# Patient Record
Sex: Female | Born: 1990 | Race: Black or African American | Hispanic: No | Marital: Single | Smoking: Former smoker
Health system: Southern US, Community
[De-identification: ages and names within clinical notes are randomized; demographics above are authoritative.]

## PROBLEM LIST (undated history)

## (undated) DIAGNOSIS — N289 Disorder of kidney and ureter, unspecified: Secondary | ICD-10-CM

## (undated) DIAGNOSIS — J45909 Unspecified asthma, uncomplicated: Secondary | ICD-10-CM

## (undated) DIAGNOSIS — G43909 Migraine, unspecified, not intractable, without status migrainosus: Secondary | ICD-10-CM

---

## 2017-10-15 ENCOUNTER — Emergency Department (HOSPITAL_COMMUNITY)
Admission: EM | Admit: 2017-10-15 | Discharge: 2017-10-15 | Disposition: A | Discharge status: Home / Self Care | Payer: Self-pay | Attending: Emergency Medicine | Admitting: Emergency Medicine

## 2017-10-15 ENCOUNTER — Other Ambulatory Visit: Payer: Self-pay

## 2017-10-15 ENCOUNTER — Encounter (HOSPITAL_COMMUNITY): Payer: Self-pay

## 2017-10-15 ENCOUNTER — Emergency Department (HOSPITAL_COMMUNITY): Payer: Self-pay

## 2017-10-15 DIAGNOSIS — F17210 Nicotine dependence, cigarettes, uncomplicated: Secondary | ICD-10-CM | POA: Insufficient documentation

## 2017-10-15 DIAGNOSIS — G8929 Other chronic pain: Secondary | ICD-10-CM | POA: Insufficient documentation

## 2017-10-15 DIAGNOSIS — M25511 Pain in right shoulder: Secondary | ICD-10-CM | POA: Insufficient documentation

## 2017-10-15 DIAGNOSIS — R519 Headache, unspecified: Secondary | ICD-10-CM

## 2017-10-15 DIAGNOSIS — M545 Low back pain: Secondary | ICD-10-CM | POA: Insufficient documentation

## 2017-10-15 DIAGNOSIS — J45909 Unspecified asthma, uncomplicated: Secondary | ICD-10-CM | POA: Insufficient documentation

## 2017-10-15 DIAGNOSIS — R51 Headache: Secondary | ICD-10-CM | POA: Insufficient documentation

## 2017-10-15 HISTORY — DX: Unspecified asthma, uncomplicated: J45.909

## 2017-10-15 HISTORY — DX: Migraine, unspecified, not intractable, without status migrainosus: G43.909

## 2017-10-15 LAB — CBC WITH DIFFERENTIAL/PLATELET
BASOS ABS: 0 10*3/uL (ref 0.0–0.1)
Basophils Relative: 0 %
EOS ABS: 0.1 10*3/uL (ref 0.0–0.7)
EOS PCT: 2 %
HCT: 35 % — ABNORMAL LOW (ref 36.0–46.0)
Hemoglobin: 11.9 g/dL — ABNORMAL LOW (ref 12.0–15.0)
LYMPHS PCT: 38 %
Lymphs Abs: 1.9 10*3/uL (ref 0.7–4.0)
MCH: 28.6 pg (ref 26.0–34.0)
MCHC: 34 g/dL (ref 30.0–36.0)
MCV: 84.1 fL (ref 78.0–100.0)
MONO ABS: 0.3 10*3/uL (ref 0.1–1.0)
Monocytes Relative: 6 %
Neutro Abs: 2.7 10*3/uL (ref 1.7–7.7)
Neutrophils Relative %: 54 %
PLATELETS: 219 10*3/uL (ref 150–400)
RBC: 4.16 MIL/uL (ref 3.87–5.11)
RDW: 14.3 % (ref 11.5–15.5)
WBC: 5.1 10*3/uL (ref 4.0–10.5)

## 2017-10-15 LAB — BASIC METABOLIC PANEL
Anion gap: 8 (ref 5–15)
BUN: 10 mg/dL (ref 6–20)
CALCIUM: 9.6 mg/dL (ref 8.9–10.3)
CO2: 22 mmol/L (ref 22–32)
CREATININE: 0.74 mg/dL (ref 0.44–1.00)
Chloride: 107 mmol/L (ref 98–111)
GFR calc Af Amer: >60 mL/min (ref >60)
GLUCOSE: 87 mg/dL (ref 70–99)
Potassium: 4.1 mmol/L (ref 3.5–5.1)
SODIUM: 137 mmol/L (ref 135–145)

## 2017-10-15 LAB — POC URINE PREG, ED: PREG TEST UR: NEGATIVE

## 2017-10-15 MED ORDER — DIPHENHYDRAMINE HCL 50 MG/ML IJ SOLN
25.0000 mg | Freq: Once | INTRAMUSCULAR | Status: AC
  Administered 2017-10-15: 25 mg via INTRAVENOUS
  Filled 2017-10-15: qty 1

## 2017-10-15 MED ORDER — SODIUM CHLORIDE 0.9 % IV BOLUS
1000.0000 mL | Freq: Once | INTRAVENOUS | Status: AC
  Administered 2017-10-15: 1000 mL via INTRAVENOUS

## 2017-10-15 MED ORDER — METOCLOPRAMIDE HCL 5 MG/ML IJ SOLN
10.0000 mg | Freq: Once | INTRAMUSCULAR | Status: AC
  Administered 2017-10-15: 10 mg via INTRAVENOUS
  Filled 2017-10-15: qty 2

## 2017-10-15 MED ORDER — DEXAMETHASONE SODIUM PHOSPHATE 10 MG/ML IJ SOLN
10.0000 mg | Freq: Once | INTRAMUSCULAR | Status: AC
  Administered 2017-10-15: 10 mg via INTRAVENOUS
  Filled 2017-10-15: qty 1

## 2017-10-15 MED ORDER — ACETAMINOPHEN 500 MG PO TABS
1000.0000 mg | ORAL_TABLET | Freq: Once | ORAL | Status: AC
  Administered 2017-10-15: 1000 mg via ORAL
  Filled 2017-10-15: qty 2

## 2017-10-15 MED ORDER — NAPROXEN 375 MG PO TABS: 375.0000 mg | ORAL_TABLET | Freq: Two times a day (BID) | ORAL | 0 refills | Status: DC

## 2017-10-15 NOTE — ED Triage Notes (Signed)
Patient c/o body aches and right arm pain x 2 months. Patient states she has been taking Ibuprofen with very little relief.  Patient also reports a migraine headache which she states she h as a history of x 2 days. Patient states she ha light sensitivity at night.

## 2017-10-15 NOTE — Care Management Note (Signed)
Case Management Note  CM consulted for no pcp and no ins with need to establish PCP and neuro follow up.  Discussed with [place] Scrape[place]ray, [place] cost of neuro appointment approx $250 out-of-pocket and clinic options with financial counseling and pharmacy.  Information on AVS.  Murray, PA advised she would discuss follow up with pt.  No further CM needs noted at this time.  Anvith Mauriello, Lynnae SandhoffAngela N, RN 10/15/2017, 1:29 PM

## 2017-10-15 NOTE — Discharge Instructions (Signed)
Please see the information and instructions below regarding your visit.  Your diagnoses today include:  1. Frontal headache   2. Chronic right shoulder pain   3. Chronic bilateral low back pain without sciatica    You were seen and treated in the emergency department today for headache. Fortunately, your vitals, exam, and work-up is reassuring with no apparent emergent cause for your headache at this time.  The x-rays of your shoulder and lower back show no signs of cysts or lesions that are concerning for any growths.  Tests performed today include: See side panel of your discharge paperwork for testing performed today. Vital signs are listed at the bottom of these instructions.   Medications prescribed:    Try to avoid daily or regular use of tylenol, aspirin, ibuprofen, and other overt-the-counter pain medications as this can contribute to rebound headaches.   Take any prescribed medications only as prescribed, and any over the counter medications only as directed on the packaging.  You are prescribed naproxen, a non-steroidal anti-inflammatory agent (NSAID) for pain. You may take 375mg  every 12 hours as needed for pain. If still requiring this medication around the clock for acute pain after 10 days, please see your primary healthcare provider.  Women who are pregnant, breastfeeding, or planning on becoming pregnant should not take non-steroidal anti-inflammatories such as Advil and Aleve. Tylenol is a safe over the counter pain reliever in pregnant women.  You may combine this medication with Tylenol, 650 mg every 6 hours, so you are receiving something for pain every 3 hours.  This is not a long-term medication unless under the care and direction of your primary provider. Taking this medication long-term and not under the supervision of a healthcare provider could increase the risk of stomach ulcers, kidney problems, and cardiovascular problems such as high blood pressure.   Home  care instructions:   Drink plenty of fluids at home. This will help with your headache. Be cautious with caffeine use, as this can cause your headache to rebound when the effects wear off. If you drink more than 2 cups of coffee/caffeinated tea, or caffeinated soda per day, I suggest you wean down that amount.  Please follow any educational materials contained in this packet.   Please apply Salon PAS patches to the painful areas of your shoulder and back.   Follow-up instructions: Please follow-up with your primary care provider as soon as possible for further evaluation of your symptoms if they are not completely improved.   Please follow up with neurology as soon as possible. I placed a referral to them.   Return instructions:  Please return to the Emergency Department if you experience worsening symptoms. It is VERY important that you monitor your symptoms at home. If you develop worsening headache, new fever, new neck stiffness, rash, focal weakness or numbness, or any other new or concerning symptoms, please return to the ED immediately, as these may be signs that your headache has become a potentially serious and life-threatening condition.  Please return if you have any other emergent concerns.  Additional Information:   Your vital signs today were: BP 112/78    Pulse 78    Temp 97.7 F (36.5 C) (Oral)    Resp 17    Ht 5\' 2"  (1.575 m)    Wt 47.6 kg    LMP 09/14/2017 (Approximate)    SpO2 100%    BMI 19.20 kg/m  If your blood pressure (BP) was elevated on multiple readings during  this visit above 130 for the top number or above 80 for the bottom number, please have this repeated by your primary care provider within one month. --------------  Thank you for allowing [place] to participate in your care today.

## 2017-10-15 NOTE — ED Provider Notes (Signed)
[facility]-EMERGENCY DEPT Provider Note   CSN: [id number] Arrival date & time: 10/15/17  1129     History   Chief Complaint Chief Complaint  Patient presents with  . Generalized Body Aches  . Migraine    HPI Ashley Wells is a 27 y.o. female.  HPI  Patient is a 27 year old female with a history of asthma and headaches presenting for right-sided shoulder and neck pain, low back pain, frontal headache, and abnormal sensations.  Patient reports that she has had shoulder right neck pain with intermittent numbness in her right upper arm for approximately 2 months without improvement or worsening.  Patient denies any neck or shoulder trauma.  Patient reports that her biceps area will occasionally have decreased sensation.  Patient also notes that other areas with decreased sensation include her left great toe.  Patient denies any weakness.  No fever or chills, neck stiffness, loss of bowel or bladder control.  Patient is concerned that the symptoms could be signs of MS, because she has an extensive history of MS in her family.  Patient also reporting that she has a right-sided frontal headache.  Patient reports that it is present for approximately 1 week, and is similar in quality and character to her prior headaches.  Patient does report that her headaches usually affect the occipital region.  Patient reports that months ago, she had a couple days of left blurred vision, but denies any visual changes at present.  Past Medical History:  Diagnosis Date  . Asthma   . Migraine     There are no active problems to display for this patient.   History reviewed. No pertinent surgical history.   OB History   None      Home Medications    Prior to Admission medications   Medication Sig Start Date End Date Taking? Authorizing Provider  albuterol (PROVENTIL HFA;VENTOLIN HFA) 108 (90 Base) MCG/ACT inhaler Inhale 2 puffs into the lungs every 4 (four) hours as  needed for wheezing or shortness of breath.   Yes [provider]  albuterol (PROVENTIL) (5 MG/ML) 0.5% nebulizer solution Take 5 mg by nebulization every 6 (six) hours as needed for wheezing or shortness of breath.   Yes [provider]  ibuprofen (ADVIL,MOTRIN) 200 MG tablet Take 600 mg by mouth every 6 (six) hours as needed for headache or mild pain.   Yes [provider]    Family History Family History  Problem Relation Age of Onset  . Multiple sclerosis Mother   . Asthma Mother   . Asthma Father     Social History Social History   Tobacco Use  . Smoking status: Current Every Day Smoker    Packs/day: 0.25    Types: Cigarettes  . Smokeless tobacco: Never Used  Substance Use Topics  . Alcohol use: Yes    Comment: occasionally  . Drug use: Yes    Types: Marijuana    Comment: weekends     Allergies   Shellfish allergy   Review of Systems Review of Systems  Constitutional: Negative for chills and fever.  HENT: Negative for congestion, rhinorrhea and sore throat.   Eyes: Negative for visual disturbance.  Respiratory: Negative for cough, chest tightness and shortness of breath.   Cardiovascular: Negative for chest pain, palpitations and leg swelling.  Gastrointestinal: Negative for abdominal pain, nausea and vomiting.  Genitourinary: Negative for dysuria and flank pain.  Musculoskeletal: Positive for arthralgias, back pain and myalgias. Negative for joint swelling.  Skin: Negative for rash.  Neurological: Positive for headaches. Negative for dizziness, syncope and light-headedness.     Physical Exam Updated Vital Signs BP (!) 105/54   Pulse 74   Temp 97.7 F (36.5 C) (Oral)   Resp 16   Ht 5\' 2"  (1.575 m)   Wt 47.6 kg   LMP 09/14/2017 (Approximate)   SpO2 100%   BMI 19.20 kg/m   Physical Exam  Constitutional: She is oriented to person, place, and time. She appears well-developed and well-nourished. No distress.  HENT:  Head:  Normocephalic and atraumatic.  Mouth/Throat: Oropharynx is clear and moist.  Eyes: Pupils are equal, round, and reactive to light. Conjunctivae and EOM are normal.  Neck: Normal range of motion. Neck supple.  Cardiovascular: Normal rate, regular rhythm, S1 normal and S2 normal.  No murmur heard. Pulmonary/Chest: Effort normal and breath sounds normal. She has no wheezes. She has no rales.  Abdominal: Soft. She exhibits no distension. There is no tenderness. There is no guarding.  Musculoskeletal: Normal range of motion. She exhibits no edema or deformity.  Right shoulder with tenderness to palpation of scapula and deltoid. Normal ROM, both active and passive. Negative empty can test, Positive Neer's. No swelling, erythema or ecchymosis present. No step-off, crepitus, or deformity appreciated. 5/5 muscle strength of RUE. 2+ radial pulse, sensation intact and all compartments soft. No midline tenderness of cervical, thoracic, or lumbar spine.  There is right-sided paraspinal lumbar muscular tenderness.  Lymphadenopathy:    She has no cervical adenopathy.  Neurological: She is alert and oriented to person, place, and time.  Mental Status:  Alert, oriented, thought content appropriate, able to give a coherent history. Speech fluent without evidence of aphasia. Able to follow 2 step commands without difficulty.  Cranial Nerves:  II:  Peripheral visual fields grossly normal, pupils equal, round, reactive to light III,IV, VI: ptosis not present, extra-ocular motions intact bilaterally  V,VII: smile symmetric, facial light touch sensation equal VIII: hearing grossly normal to voice  X: uvula elevates symmetrically  XI: bilateral shoulder shrug symmetric and strong XII: midline tongue extension without fassiculations Motor:  Normal tone. 5/5 in upper and lower extremities bilaterally including strong and equal grip strength and dorsiflexion/plantar flexion Sensory: Pinprick and light touch normal in  all extremities.  Deep Tendon Reflexes: 2+ and symmetric in the biceps and patella. No clonus. Cerebellar: normal finger-to-nose with bilateral upper extremities Gait: normal gait and balance Stance:  No pronator drift and good coordination, strength, and position sense with tapping of bilateral arms (performed in sitting position). CV: distal pulses palpable throughout   Skin: Skin is warm and dry. No rash noted. No erythema.  Psychiatric: She has a normal mood and affect. Her behavior is normal. Judgment and thought content normal.  Nursing note and vitals reviewed.    ED Treatments / Results  Labs (all labs ordered are listed, but only abnormal results are displayed) Labs Reviewed  CBC WITH DIFFERENTIAL/PLATELET - Abnormal; Notable for the following components:      Result Value   Hemoglobin 11.9 (*)    HCT 35.0 (*)    All other components within normal limits  BASIC METABOLIC PANEL  POC URINE PREG, ED    EKG None  Radiology Dg Lumbar Spine Complete  Result Date: 10/15/2017 CLINICAL DATA:  Low back pain for 2 months, worsening for 2 days. EXAM: LUMBAR SPINE - COMPLETE 4+ VIEW COMPARISON:  None. 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  Vertebral bodies intact and aligned with straightened lumbar lordosis. Intervertebral disc heights preserved. No destructive bony lesions. Phleboliths project RIGHT pelvis. Sacroiliac joints are symmetric. Prevertebral and paraspinal soft tissue planes are normal. IMPRESSION: 1. No fracture deformity or malalignment. 2. Straightened lordosis. 3. Transitional anatomy. Electronically Signed   By: Awilda Metro M.D.   On: 10/15/2017 15:16   Dg Shoulder Right  Result Date: 10/15/2017 CLINICAL DATA:  RIGHT shoulder pain for 2 months, worsening for 2 days. EXAM: RIGHT SHOULDER - 2+ VIEW COMPARISON:  None. FINDINGS: The humeral head is well-formed and located. The subacromial, glenohumeral and  acromioclavicular joint spaces are intact. No destructive bony lesions. Soft tissue planes are non-suspicious. IMPRESSION: Normal. Electronically Signed   By: Awilda Metro M.D.   On: 10/15/2017 15:15    Procedures Procedures (including critical care time)  Medications Ordered in ED Medications  sodium chloride 0.9 % bolus 1,000 mL (has no administration in time range)  acetaminophen (TYLENOL) tablet 1,000 mg (has no administration in time range)  metoCLOPramide (REGLAN) injection 10 mg (has no administration in time range)  diphenhydrAMINE (BENADRYL) injection 25 mg (has no administration in time range)  dexamethasone (DECADRON) injection 10 mg (has no administration in time range)     Initial Impression / Assessment and Plan / ED Course  I have reviewed the triage vital signs and the nursing notes.  Pertinent labs & imaging results that were available during my care of the patient were reviewed by me and considered in my medical decision making (see chart for details).  Clinical Course as of Oct 15 1712  Tue Oct 15, 2017  1619 Reassessed. Headache gone and patient reports that she feels much better.   [AM]    Clinical Course User Index [AM] Elisha Ponder, PA-C   Patient without high-risk features of headache including: sudden onset/thunderclap HA, no similar headache in past, altered mental status, accompanying seizure, headache with exertion, age > 69, history of immunocompromise, fever, use of anticoagulation, family history of spontaneous SAH, concomitant drug use, toxic exposure, or history of hypercoagulable state.   Patient does have high risk family history for MS, but is having no signs or symptoms consistent with MS today.  I engaged in shared decision-making with the patient regarding MRI today versus waiting for outpatient work-up and financial counseling versus returning for any worsening symptoms.  Patient elected to wait to see if she has any returning symptoms  to be reevaluated.  I discussed this is reasonable and encouraged return for any visual disturbance, weakness or numbness in any extremity. Neurologic consult placed and CM consulted to assist with placing Wellness clinic referrals. Appreciate CM involvement in the care of this patient.   Patient shoulder examination is consistent with musculoskeletal pain, and patient has no nuchal rigidity or meningismus, or other toxic features concerning for meningitis as cause of neck and shoulder pain.  No acute neurologic symptoms to suggest cervical artery dissection.  X-rays are negative for unicameral bone cyst, bone tumor, or possible lytic lesions.  No abnormal labs.  Patient has a normal complete neurological exam, normal vital signs, normal level of consciousness, no signs of meningismus, is well-appearing/non-toxic appearing, no signs of trauma.   Imaging with CT/MRI not indicated given history and physical exam findings.   No dangerous or life-threatening conditions suspected or identified by history, physical exam, and by work-up. No indications for hospitalization identified.  Return precautions given to patient for any worsening pain, neurologic signs or symptoms, fever or  chills, neck rigidity.  Patient is in understanding and agrees with the plan of care.   Final Clinical Impressions(s) / ED Diagnoses   Final diagnoses:  Frontal headache  Chronic right shoulder pain  Chronic bilateral low back pain without sciatica    ED Discharge Orders         Ordered    naproxen (NAPROSYN) 375 MG tablet  2 times daily     10/15/17 1621    Ambulatory referral to Neurology    Comments:  An appointment is requested in approximately: 2 weeks. Patient with extensive family history of MS. A couple months ago pt had transient blurred vision in left eye, intermittent numbness in great toe, right upper extremity.   10/15/17 1624           Aviva Kluver B, PA-C 10/15/17 1721    Loren Racer,  MD 10/16/17 1328

## 2017-11-14 ENCOUNTER — Ambulatory Visit: Payer: Self-pay | Admitting: Neurology

## 2017-11-28 ENCOUNTER — Encounter: Payer: Self-pay | Admitting: Neurology

## 2017-11-28 ENCOUNTER — Telehealth: Payer: Self-pay | Admitting: *Deleted

## 2017-11-28 ENCOUNTER — Ambulatory Visit: Payer: Self-pay | Admitting: Neurology

## 2017-11-28 NOTE — Telephone Encounter (Signed)
Pt. noshowed her new patient appt. with Dr. Sater today/fim 

## 2017-12-14 ENCOUNTER — Emergency Department (HOSPITAL_COMMUNITY)
Admission: EM | Admit: 2017-12-14 | Discharge: 2017-12-14 | Disposition: A | Discharge status: Home / Self Care | Payer: Self-pay | Attending: Emergency Medicine | Admitting: Emergency Medicine

## 2017-12-14 ENCOUNTER — Emergency Department (HOSPITAL_COMMUNITY): Payer: Self-pay

## 2017-12-14 ENCOUNTER — Encounter (HOSPITAL_COMMUNITY): Payer: Self-pay

## 2017-12-14 ENCOUNTER — Other Ambulatory Visit: Payer: Self-pay

## 2017-12-14 DIAGNOSIS — M7918 Myalgia, other site: Secondary | ICD-10-CM | POA: Insufficient documentation

## 2017-12-14 DIAGNOSIS — R509 Fever, unspecified: Secondary | ICD-10-CM | POA: Insufficient documentation

## 2017-12-14 DIAGNOSIS — J189 Pneumonia, unspecified organism: Secondary | ICD-10-CM | POA: Insufficient documentation

## 2017-12-14 DIAGNOSIS — R05 Cough: Secondary | ICD-10-CM | POA: Insufficient documentation

## 2017-12-14 DIAGNOSIS — J4521 Mild intermittent asthma with (acute) exacerbation: Secondary | ICD-10-CM | POA: Insufficient documentation

## 2017-12-14 DIAGNOSIS — F1290 Cannabis use, unspecified, uncomplicated: Secondary | ICD-10-CM | POA: Insufficient documentation

## 2017-12-14 DIAGNOSIS — F17210 Nicotine dependence, cigarettes, uncomplicated: Secondary | ICD-10-CM | POA: Insufficient documentation

## 2017-12-14 LAB — I-STAT BETA HCG BLOOD, ED (MC, WL, AP ONLY)

## 2017-12-14 MED ORDER — DOXYCYCLINE HYCLATE 100 MG PO CAPS: 100.0000 mg | ORAL_CAPSULE | Freq: Two times a day (BID) | ORAL | 0 refills | Status: DC

## 2017-12-14 MED ORDER — SODIUM CHLORIDE 0.9 % IV BOLUS
500.0000 mL | Freq: Once | INTRAVENOUS | Status: AC
  Administered 2017-12-14: 500 mL via INTRAVENOUS

## 2017-12-14 MED ORDER — IPRATROPIUM-ALBUTEROL 0.5-2.5 (3) MG/3ML IN SOLN
3.0000 mL | Freq: Once | RESPIRATORY_TRACT | Status: AC
  Administered 2017-12-14: 3 mL via RESPIRATORY_TRACT
  Filled 2017-12-14: qty 3

## 2017-12-14 MED ORDER — IBUPROFEN 200 MG PO TABS
600.0000 mg | ORAL_TABLET | Freq: Once | ORAL | Status: AC
  Administered 2017-12-14: 600 mg via ORAL
  Filled 2017-12-14: qty 3

## 2017-12-14 MED ORDER — PREDNISONE 10 MG PO TABS: 40.0000 mg | ORAL_TABLET | Freq: Every day | ORAL | 0 refills | Status: AC

## 2017-12-14 NOTE — ED Provider Notes (Signed)
[facility]-EMERGENCY DEPT Provider Note   CSN: [id number] Arrival date & time: 12/14/17  1036     History   Chief Complaint Chief Complaint  Patient presents with  . Shortness of Breath    HPI Ashley Wells is a 27 y.o. female with a past medical history of asthma, who presents to ED for evaluation of influenza-like illness including generalized body aches, fever with T-max 102, decreased appetite, sore throat, cough productive with mucus for the past 2-2.5 days.  States that she was at work today when all of a sudden she became short of breath and had wheezing.  She used her inhaler with no improvement in her symptoms.  She called EMS.  She was given a breathing treatment and Solu-Medrol and wrote which she states helped her symptoms.  She reports having similar asthma exacerbations during this time of the year.  No sick contacts with similar symptoms.  Denies any chest pain, hemoptysis, vomiting, abdominal pain.  HPI  Past Medical History:  Diagnosis Date  . Asthma   . Migraine     There are no active problems to display for this patient.   History reviewed. No pertinent surgical history.   OB History   None      Home Medications    Prior to Admission medications   Medication Sig Start Date End Date Taking? Authorizing Provider  albuterol (PROVENTIL HFA;VENTOLIN HFA) 108 (90 Base) MCG/ACT inhaler Inhale 2 puffs into the lungs every 4 (four) hours as needed for wheezing or shortness of breath.   Yes [provider]  albuterol (PROVENTIL) (5 MG/ML) 0.5% nebulizer solution Take 5 mg by nebulization every 6 (six) hours as needed for wheezing or shortness of breath.   Yes [provider]  ibuprofen (ADVIL,MOTRIN) 200 MG tablet Take 600 mg by mouth every 6 (six) hours as needed for headache or mild pain.   Yes [provider]  naproxen (NAPROSYN) 375 MG tablet Take 1 tablet (375 mg total) by mouth 2 (two) times daily.  10/15/17  Yes [place] Scrape, Alyssa B, PA-C  doxycycline (VIBRAMYCIN) 100 MG capsule Take 1 capsule (100 mg total) by mouth 2 (two) times daily. 12/14/17   Dietrich Pates, PA-C    Family History Family History  Problem Relation Age of Onset  . Multiple sclerosis Mother   . Asthma Mother   . Asthma Father     Social History Social History   Tobacco Use  . Smoking status: Current Every Day Smoker    Packs/day: 0.25    Types: Cigarettes  . Smokeless tobacco: Never Used  Substance Use Topics  . Alcohol use: Yes    Comment: occasionally  . Drug use: Yes    Types: Marijuana    Comment: weekends     Allergies   Shellfish allergy   Review of Systems Review of Systems  Constitutional: Positive for appetite change, chills and fever.  HENT: Positive for congestion and sore throat. Negative for ear pain, rhinorrhea and sneezing.   Eyes: Negative for photophobia and visual disturbance.  Respiratory: Positive for cough, shortness of breath and wheezing. Negative for chest tightness.   Cardiovascular: Negative for chest pain and palpitations.  Gastrointestinal: Negative for abdominal pain, blood in stool, constipation, diarrhea, nausea and vomiting.  Genitourinary: Negative for dysuria, hematuria and urgency.  Musculoskeletal: Positive for myalgias.  Skin: Negative for rash.  Neurological: Negative for dizziness, weakness and light-headedness.     Physical Exam Updated Vital Signs BP 119/72  Pulse 77   Temp 98.5 F (36.9 C) (Oral)   Resp 20   Ht 5\' 2"  (1.575 m)   Wt 49.9 kg   LMP 12/03/2017 (Exact Date)   SpO2 100%   BMI 20.12 kg/m   Physical Exam  Constitutional: She appears well-developed and well-nourished. No distress.  Nontoxic-appearing in no acute distress.  Speaking complete sentences without difficulty.  HENT:  Head: Normocephalic and atraumatic.  Nose: Nose normal.  Eyes: Conjunctivae and EOM are normal. Left eye exhibits no discharge. No scleral icterus.  Neck:  Normal range of motion. Neck supple.  Cardiovascular: Normal rate, regular rhythm, normal heart sounds and intact distal pulses. Exam reveals no gallop and no friction rub.  No murmur heard. Pulmonary/Chest: Effort normal and breath sounds normal. No respiratory distress.  Clear breath sounds bilaterally.  Abdominal: Soft. Bowel sounds are normal. She exhibits no distension. There is no tenderness. There is no guarding.  Musculoskeletal: Normal range of motion. She exhibits no edema.  Neurological: She is alert. She exhibits normal muscle tone. Coordination normal.  Skin: Skin is warm and dry. No rash noted.  Psychiatric: She has a normal mood and affect.  Nursing note and vitals reviewed.    ED Treatments / Results  Labs (all labs ordered are listed, but only abnormal results are displayed) Labs Reviewed  I-STAT BETA HCG BLOOD, ED ([facility], [facility], [facility] ONLY)    EKG None  Radiology Dg Chest 2 View  Result Date: 12/14/2017 CLINICAL DATA:  Cough and shortness of breath. EXAM: CHEST - 2 VIEW COMPARISON:  None. FINDINGS: No pneumothorax. The heart, hila, and mediastinum are normal. The left lung is clear. Mild hazy opacity seen in the right perihilar region. No other acute abnormalities identified. IMPRESSION: Mild hazy opacity in the right perihilar region is suspicious for an infectious process given history. Atypical infections or an early pneumonia could present with this appearance. Recommend clinical correlation and follow-up to resolution. Electronically Signed   By: Gerome Sam III M.D   On: 12/14/2017 12:15    Procedures Procedures (including critical care time)  Medications Ordered in ED Medications  sodium chloride 0.9 % bolus 500 mL (500 mLs Intravenous New Bag/Given 12/14/17 1327)  ibuprofen (ADVIL,MOTRIN) tablet 600 mg (600 mg Oral Given 12/14/17 1327)  ipratropium-albuterol (DUONEB) 0.5-2.5 (3) MG/3ML nebulizer solution 3 mL (3 mLs Nebulization Given 12/14/17 1246)      Initial Impression / Assessment and Plan / ED Course  I have reviewed the triage vital signs and the nursing notes.  Pertinent labs & imaging results that were available during my care of the patient were reviewed by me and considered in my medical decision making (see chart for details).     27 year old female with a past medical history of asthma presents to ED for influenza-like illness including generalized body aches, fever with T-max 102, decreased appetite, sore throat, cough productive with mucus for the past 2-1/2 days.  She was at work earlier today when she suddenly became short of breath and had wheezing.  She called EMS and was given a DuoNeb and Solu-Medrol which she states did help her symptoms.  She reports having similar asthma exacerbation during this time of the year.  No sick contacts with similar symptoms.  Did not receive her influenza vaccine this year.  Denies any chest pain, hemoptysis.  On exam patient's breath sounds clear with no wheezing or signs of respiratory distress noted.  She is afebrile with no recent use of antipyretics.  Oxygen saturations are 100% on room air.  She is not tachypneic or tachycardic.  Chest x-ray shows possible infiltrate in the right lung.  hCG is negative.  Patient given additional DuoNeb treatment, ibuprofen and fluids with significant improvement and near resolution of her symptoms.  Patient states that she feels "much better" than these past 2 days.  Patient is out of the window for Tamiflu administration.  We will treat with antibiotics, steroids and refill for inhaler.  Suspect that her symptoms are due to asthma exacerbation in setting of possible pneumonia or influenza-like illness.  Doubt PE or other cardiac cause of symptoms.  She remains in no acute distress with stable vital signs.  Will advise her to return to ED for any severe worsening symptoms.  Portions of this note were generated with Scientist, clinical (histocompatibility and immunogenetics). Dictation errors  may occur despite best attempts at proofreading.   Final Clinical Impressions(s) / ED Diagnoses   Final diagnoses:  Mild intermittent asthma with exacerbation  Community acquired pneumonia of right lung, unspecified part of lung    ED Discharge Orders         Ordered    doxycycline (VIBRAMYCIN) 100 MG capsule  2 times daily     12/14/17 1419           Dietrich Pates, PA-C 12/14/17 1421    Doug Sou, MD 12/14/17 1625

## 2017-12-14 NOTE — ED Triage Notes (Signed)
EMS reports from home, SOB x 2 days, had cold x several days, uses nebulizer and inhaler, Hx of Asthma.   BP 118/78 HR 11 RR 28 Sp02 95 RA  10mg  Albuterol 1mg  Atrovent 125mg  Solumedraol enroute . 20ga LAC

## 2017-12-14 NOTE — Discharge Instructions (Addendum)
Take steroids beginning tomorrow. Complete the entire course of your antibiotics and steroids to prevent recurrence or worsening of your infection or asthma. Return to ED for worsening symptoms, trouble breathing or trouble swallowing, severe chest or abdominal pain, vomiting or coughing up blood.

## 2019-11-24 ENCOUNTER — Emergency Department (HOSPITAL_COMMUNITY)
Admission: EM | Admit: 2019-11-24 | Discharge: 2019-11-24 | Disposition: A | Discharge status: Home / Self Care | Payer: Self-pay | Attending: Emergency Medicine | Admitting: Emergency Medicine

## 2019-11-24 ENCOUNTER — Other Ambulatory Visit: Payer: Self-pay

## 2019-11-24 ENCOUNTER — Encounter (HOSPITAL_COMMUNITY): Payer: Self-pay | Admitting: Emergency Medicine

## 2019-11-24 DIAGNOSIS — J45909 Unspecified asthma, uncomplicated: Secondary | ICD-10-CM | POA: Insufficient documentation

## 2019-11-24 DIAGNOSIS — Z5321 Procedure and treatment not carried out due to patient leaving prior to being seen by health care provider: Secondary | ICD-10-CM | POA: Insufficient documentation

## 2019-11-24 MED ORDER — ALBUTEROL SULFATE HFA 108 (90 BASE) MCG/ACT IN AERS
2.0000 | INHALATION_SPRAY | Freq: Once | RESPIRATORY_TRACT | Status: AC
  Administered 2019-11-24: 2 via RESPIRATORY_TRACT
  Filled 2019-11-24: qty 6.7

## 2019-11-24 NOTE — ED Notes (Signed)
Unable to locate in lobby. 

## 2019-11-24 NOTE — ED Triage Notes (Signed)
Pt arrives to ED with complaints of her an asthma flare up. Pt states she woke up last night SOB and had no relief from her asthma.

## 2020-03-29 ENCOUNTER — Emergency Department (HOSPITAL_COMMUNITY): Payer: Self-pay

## 2020-03-29 ENCOUNTER — Emergency Department (HOSPITAL_COMMUNITY)
Admission: EM | Admit: 2020-03-29 | Discharge: 2020-03-29 | Disposition: A | Discharge status: Home / Self Care | Payer: Self-pay | Attending: Emergency Medicine | Admitting: Emergency Medicine

## 2020-03-29 ENCOUNTER — Encounter (HOSPITAL_COMMUNITY): Payer: Self-pay | Admitting: Emergency Medicine

## 2020-03-29 DIAGNOSIS — J45909 Unspecified asthma, uncomplicated: Secondary | ICD-10-CM | POA: Insufficient documentation

## 2020-03-29 DIAGNOSIS — N3000 Acute cystitis without hematuria: Secondary | ICD-10-CM | POA: Insufficient documentation

## 2020-03-29 DIAGNOSIS — F17210 Nicotine dependence, cigarettes, uncomplicated: Secondary | ICD-10-CM | POA: Insufficient documentation

## 2020-03-29 HISTORY — DX: Disorder of kidney and ureter, unspecified: N28.9

## 2020-03-29 LAB — URINALYSIS, ROUTINE W REFLEX MICROSCOPIC
Bacteria, UA: NONE SEEN
Bilirubin Urine: NEGATIVE
Glucose, UA: NEGATIVE mg/dL
Ketones, ur: NEGATIVE mg/dL
Nitrite: NEGATIVE
Protein, ur: NEGATIVE mg/dL
Specific Gravity, Urine: 1.002 — ABNORMAL LOW (ref 1.005–1.030)
pH: 6 (ref 5.0–8.0)

## 2020-03-29 LAB — I-STAT BETA HCG BLOOD, ED (MC, WL, AP ONLY): I-stat hCG, quantitative: <5 m[IU]/mL (ref <5)

## 2020-03-29 LAB — CBC
HCT: 32.6 % — ABNORMAL LOW (ref 36.0–46.0)
Hemoglobin: 10.6 g/dL — ABNORMAL LOW (ref 12.0–15.0)
MCH: 27.7 pg (ref 26.0–34.0)
MCHC: 32.5 g/dL (ref 30.0–36.0)
MCV: 85.1 fL (ref 80.0–100.0)
Platelets: 195 10*3/uL (ref 150–400)
RBC: 3.83 MIL/uL — ABNORMAL LOW (ref 3.87–5.11)
RDW: 14.5 % (ref 11.5–15.5)
WBC: 9.4 10*3/uL (ref 4.0–10.5)
nRBC: 0 % (ref 0.0–0.2)

## 2020-03-29 LAB — BASIC METABOLIC PANEL
Anion gap: 10 (ref 5–15)
BUN: 7 mg/dL (ref 6–20)
CO2: 25 mmol/L (ref 22–32)
Calcium: 9.7 mg/dL (ref 8.9–10.3)
Chloride: 102 mmol/L (ref 98–111)
Creatinine, Ser: 0.7 mg/dL (ref 0.44–1.00)
GFR, Estimated: >60 mL/min (ref >60)
Glucose, Bld: 101 mg/dL — ABNORMAL HIGH (ref 70–99)
Potassium: 3.1 mmol/L — ABNORMAL LOW (ref 3.5–5.1)
Sodium: 137 mmol/L (ref 135–145)

## 2020-03-29 MED ORDER — CEPHALEXIN 500 MG PO CAPS: 500.0000 mg | ORAL_CAPSULE | Freq: Two times a day (BID) | ORAL | 0 refills | Status: AC

## 2020-03-29 NOTE — ED Triage Notes (Signed)
Per EMS-coming from [facility]-complaining of right flank pain-tylenol given for fever around 1600-temp 99.4-ambulatory-no s/s's of distress-history of kidney stones

## 2020-03-29 NOTE — ED Provider Notes (Signed)
[facility]-EMERGENCY DEPT Provider Note   CSN: [id number] Arrival date & time: 03/29/20  1805     History Chief Complaint  Patient presents with  . Flank Pain    Ashley Wells is a 30 y.o. female.  The history is provided by the patient.  Flank Pain This is a new problem. The problem occurs constantly. The problem has been gradually improving. Associated symptoms include abdominal pain. Pertinent negatives include no chest pain, no headaches and no shortness of breath. Nothing aggravates the symptoms. Nothing relieves the symptoms. She has tried nothing for the symptoms.       Past Medical History:  Diagnosis Date  . Asthma   . Migraine   . Renal disorder     There are no problems to display for this patient.   History reviewed. No pertinent surgical history.   OB History   No obstetric history on file.     Family History  Problem Relation Age of Onset  . Multiple sclerosis Mother   . Asthma Mother   . Asthma Father     Social History   Tobacco Use  . Smoking status: Current Every Day Smoker    Packs/day: 0.25    Types: Cigarettes  . Smokeless tobacco: Never Used  Vaping Use  . Vaping Use: Never used  Substance Use Topics  . Alcohol use: Yes    Comment: occasionally  . Drug use: Yes    Types: Marijuana    Comment: weekends    Home Medications Prior to Admission medications   Medication Sig Start Date End Date Taking? Authorizing Provider  cephALEXin (KEFLEX) 500 MG capsule Take 1 capsule (500 mg total) by mouth 2 (two) times daily for 5 days. 03/29/20 04/03/20 Yes Leiby Pigeon, DO  albuterol (PROVENTIL HFA;VENTOLIN HFA) 108 (90 Base) MCG/ACT inhaler Inhale 2 puffs into the lungs every 4 (four) hours as needed for wheezing or shortness of breath.    [provider]  albuterol (PROVENTIL) (5 MG/ML) 0.5% nebulizer solution Take 5 mg by nebulization every 6 (six) hours as needed for wheezing or shortness of breath.     [provider]  doxycycline (VIBRAMYCIN) 100 MG capsule Take 1 capsule (100 mg total) by mouth 2 (two) times daily. 12/14/17   Khatri, Hina, PA-C  ibuprofen (ADVIL,MOTRIN) 200 MG tablet Take 600 mg by mouth every 6 (six) hours as needed for headache or mild pain.    [provider]  naproxen (NAPROSYN) 375 MG tablet Take 1 tablet (375 mg total) by mouth 2 (two) times daily. 10/15/17   Aviva Kluver B, PA-C    Allergies    Shellfish allergy  Review of Systems   Review of Systems  Constitutional: Negative for chills and fever.  HENT: Negative for ear pain and sore throat.   Eyes: Negative for pain and visual disturbance.  Respiratory: Negative for cough and shortness of breath.   Cardiovascular: Negative for chest pain and palpitations.  Gastrointestinal: Positive for abdominal pain. Negative for vomiting.  Genitourinary: Positive for dysuria and flank pain. Negative for hematuria, pelvic pain, vaginal bleeding and vaginal discharge.  Musculoskeletal: Negative for arthralgias and back pain.  Skin: Negative for color change and rash.  Neurological: Negative for seizures, syncope and headaches.  All other systems reviewed and are negative.   Physical Exam Updated Vital Signs BP 112/80 (BP Location: Right Arm)   Pulse 97   Temp 98.4 F (36.9 C) (Oral)   Resp 16   SpO2  99%   Physical Exam Vitals and nursing note reviewed.  Constitutional:      General: She is not in acute distress.    Appearance: She is well-developed and well-nourished. She is not ill-appearing.  HENT:     Head: Normocephalic and atraumatic.     Nose: Nose normal.     Mouth/Throat:     Mouth: Mucous membranes are moist.  Eyes:     Extraocular Movements: Extraocular movements intact.     Conjunctiva/sclera: Conjunctivae normal.     Pupils: Pupils are equal, round, and reactive to light.  Cardiovascular:     Rate and Rhythm: Normal rate and regular rhythm.     Pulses: Normal pulses.      Heart sounds: Normal heart sounds. No murmur heard.   Pulmonary:     Effort: Pulmonary effort is normal. No respiratory distress.     Breath sounds: Normal breath sounds.  Abdominal:     Palpations: Abdomen is soft.     Tenderness: There is no abdominal tenderness.  Musculoskeletal:        General: No edema.     Cervical back: Neck supple.  Skin:    General: Skin is warm and dry.     Capillary Refill: Capillary refill takes less than 2 seconds.  Neurological:     General: No focal deficit present.     Mental Status: She is alert.  Psychiatric:        Mood and Affect: Mood and affect normal.     ED Results / Procedures / Treatments   Labs (all labs ordered are listed, but only abnormal results are displayed) Labs Reviewed  URINALYSIS, ROUTINE W REFLEX MICROSCOPIC - Abnormal; Notable for the following components:      Result Value   Color, Urine STRAW (*)    Specific Gravity, Urine 1.002 (*)    Hgb urine dipstick MODERATE (*)    Leukocytes,Ua MODERATE (*)    All other components within normal limits  BASIC METABOLIC PANEL - Abnormal; Notable for the following components:   Potassium 3.1 (*)    Glucose, Bld 101 (*)    All other components within normal limits  CBC - Abnormal; Notable for the following components:   RBC 3.83 (*)    Hemoglobin 10.6 (*)    HCT 32.6 (*)    All other components within normal limits  URINE CULTURE  I-STAT BETA HCG BLOOD, ED ([facility], [facility], [facility] ONLY)    EKG None  Radiology CT Renal Stone Study  Result Date: 03/29/2020 CLINICAL DATA:  Right flank pain EXAM: CT ABDOMEN AND PELVIS WITHOUT CONTRAST TECHNIQUE: Multidetector CT imaging of the abdomen and pelvis was performed following the standard protocol without IV contrast. COMPARISON:  None. FINDINGS: LOWER CHEST: Normal. HEPATOBILIARY: Normal hepatic contours. No intra- or extrahepatic biliary dilatation. The gallbladder is normal. PANCREAS: Normal pancreas. No ductal dilatation or peripancreatic  fluid collection. SPLEEN: Normal. ADRENALS/URINARY TRACT: The adrenal glands are normal. No hydronephrosis, nephroureterolithiasis or solid renal mass. The urinary bladder is normal for degree of distention STOMACH/BOWEL: There is no hiatal hernia. Normal duodenal course and caliber. No small bowel dilatation or inflammation. Large amount of gas within the hepatic and splenic flexures. Normal appendix. VASCULAR/LYMPHATIC: Normal course and caliber of the major abdominal vessels. No abdominal or pelvic lymphadenopathy. REPRODUCTIVE: Normal uterus. No adnexal mass. MUSCULOSKELETAL. No bony spinal canal stenosis or focal osseous abnormality. OTHER: None. IMPRESSION: 1. No obstructive uropathy or other acute abnormality of the abdomen or pelvis. 2.  Large amount of gas within the hepatic and splenic flexures. Electronically Signed   By: Deatra Robinson M.D.   On: 03/29/2020 21:11    Procedures Procedures   Medications Ordered in ED Medications - No data to display  ED Course  I have reviewed the triage vital signs and the nursing notes.  Pertinent labs & imaging results that were available during my care of the patient were reviewed by me and considered in my medical decision making (see chart for details).    MDM Rules/Calculators/A&P                          Ashley Wells is a 30 year old female who presents to the ED with painful urination, flank pain.  Normal vitals.  No fever.  Urinalysis consistent with infection.  Not having any GU symptoms otherwise.  No vaginal discharge or bleeding.  Pregnancy test negative.  No significant anemia, electrolyte abnormality, kidney injury.  Patient does have history of kidney stones a CT scan did not show any evidence of kidney stones.  She did have some bowel gas which also could be causing some of her discomfort.  Will prescribe antibiotics and discharged from ED in good condition.  This chart was dictated using voice recognition software.  Despite best  efforts to proofread,  errors can occur which can change the documentation meaning.    Final Clinical Impression(s) / ED Diagnoses Final diagnoses:  Acute cystitis without hematuria    Rx / DC Orders ED Discharge Orders         Ordered    cephALEXin (KEFLEX) 500 MG capsule  2 times daily        03/29/20 2210           Virgina Norfolk, DO 03/29/20 2212

## 2020-03-31 LAB — URINE CULTURE: Culture: 10000 — AB

## 2020-04-01 LAB — URINE CULTURE

## 2020-04-02 ENCOUNTER — Telehealth: Payer: Self-pay | Admitting: Emergency Medicine

## 2020-04-02 NOTE — Telephone Encounter (Signed)
Post ED Visit - Positive Culture Follow-up  Culture report reviewed by antimicrobial stewardship pharmacist: [facility] []  , Pharm.D. []  Enzo Bi, Pharm.D., BCPS AQ-ID []  , Pharm.D., BCPS []  Celedonio Miyamoto, Pharm.D., BCPS []  Blanchard, Garvin Fila.D., BCPS, AAHIVP []  , Pharm.D., BCPS, AAHIVP []  Georgina Pillion, PharmD, BCPS []  , PharmD, BCPS []  [place], PharmD, BCPS []  [address], PharmD []  , PharmD, BCPS []  Estella Husk, PharmD  Pharmacy Team []  Lysle Pearl, PharmD []  , PharmD []  Phillips Climes, PharmD [x]  , Rph []  Agapito Games) , PharmD []  Verlan Friends, PharmD []  , PharmD []  Mervyn Gay, PharmD []  , PharmD []  Vinnie Level, PharmD []  [facility], PharmD []  , PharmD []  Len Childs, PharmD   Positive urine culture Treated with Cephalexin, organism sensitive to the same and no further patient follow-up is required at this time.  Tammera Engert 04/02/2020, 6:09 PM

## 2020-07-12 ENCOUNTER — Encounter (HOSPITAL_COMMUNITY): Payer: Self-pay

## 2020-07-12 ENCOUNTER — Other Ambulatory Visit: Payer: Self-pay

## 2020-07-12 ENCOUNTER — Emergency Department (HOSPITAL_COMMUNITY): Payer: Self-pay

## 2020-07-12 ENCOUNTER — Emergency Department (HOSPITAL_COMMUNITY)
Admission: EM | Admit: 2020-07-12 | Discharge: 2020-07-12 | Disposition: A | Discharge status: Home / Self Care | Payer: Self-pay | Attending: Emergency Medicine | Admitting: Emergency Medicine

## 2020-07-12 DIAGNOSIS — M791 Myalgia, unspecified site: Secondary | ICD-10-CM | POA: Insufficient documentation

## 2020-07-12 DIAGNOSIS — R112 Nausea with vomiting, unspecified: Secondary | ICD-10-CM | POA: Insufficient documentation

## 2020-07-12 DIAGNOSIS — R509 Fever, unspecified: Secondary | ICD-10-CM | POA: Insufficient documentation

## 2020-07-12 DIAGNOSIS — Z87891 Personal history of nicotine dependence: Secondary | ICD-10-CM | POA: Insufficient documentation

## 2020-07-12 DIAGNOSIS — R519 Headache, unspecified: Secondary | ICD-10-CM | POA: Insufficient documentation

## 2020-07-12 DIAGNOSIS — R0602 Shortness of breath: Secondary | ICD-10-CM | POA: Insufficient documentation

## 2020-07-12 DIAGNOSIS — Z20822 Contact with and (suspected) exposure to covid-19: Secondary | ICD-10-CM

## 2020-07-12 DIAGNOSIS — J3489 Other specified disorders of nose and nasal sinuses: Secondary | ICD-10-CM | POA: Insufficient documentation

## 2020-07-12 DIAGNOSIS — J45909 Unspecified asthma, uncomplicated: Secondary | ICD-10-CM | POA: Insufficient documentation

## 2020-07-12 DIAGNOSIS — R059 Cough, unspecified: Secondary | ICD-10-CM | POA: Insufficient documentation

## 2020-07-12 MED ORDER — ALBUTEROL SULFATE HFA 108 (90 BASE) MCG/ACT IN AERS
2.0000 | INHALATION_SPRAY | Freq: Once | RESPIRATORY_TRACT | Status: AC
  Administered 2020-07-12: 2 via RESPIRATORY_TRACT
  Filled 2020-07-12: qty 6.7

## 2020-07-12 MED ORDER — ACETAMINOPHEN 325 MG PO TABS: 650.0000 mg | ORAL_TABLET | Freq: Once | ORAL | Status: DC

## 2020-07-12 MED ORDER — AEROCHAMBER Z-STAT PLUS/MEDIUM MISC
1.0000 | Freq: Once | Status: AC
  Administered 2020-07-12: 1

## 2020-07-12 MED ORDER — ONDANSETRON 4 MG PO TBDP: 4.0000 mg | ORAL_TABLET | Freq: Three times a day (TID) | ORAL | 0 refills | Status: DC | PRN

## 2020-07-12 MED ORDER — ONDANSETRON 4 MG PO TBDP
4.0000 mg | ORAL_TABLET | Freq: Once | ORAL | Status: AC
  Administered 2020-07-12: 4 mg via ORAL
  Filled 2020-07-12: qty 1

## 2020-07-12 MED ORDER — ACETAMINOPHEN 325 MG PO TABS
650.0000 mg | ORAL_TABLET | Freq: Once | ORAL | Status: AC | PRN
  Administered 2020-07-12: 650 mg via ORAL
  Filled 2020-07-12: qty 2

## 2020-07-12 NOTE — Discharge Instructions (Addendum)
As we discussed even if your test from [facility] comes back negative I would recommend assuming you have COVID and isolating appropriately. If your symptoms worsen or you have other concerns please seek additional medical care and evaluation.  Please take Ibuprofen (Advil, motrin) and Tylenol (acetaminophen) to relieve your pain.    You may take up to 600 MG (3 pills) of normal strength ibuprofen every 8 hours as needed.   You make take tylenol, up to 1,000 mg (two extra strength pills) every 8 hours as needed.   It is safe to take ibuprofen and tylenol at the same time as they work differently.   Do not take more than 3,000 mg tylenol in a 24 hour period (not more than one dose every 8 hours.  Please check all medication labels as many medications such as pain and cold medications may contain tylenol.  Do not drink alcohol while taking these medications.  Do not take other NSAID'S while taking ibuprofen (such as aleve or naproxen).  Please take ibuprofen with food to decrease stomach upset.  Please check the CDC for latest guidelines.  You should isolate at home for five days.  After that you should wear a well fitting mask and not go around anyone who is high risk for an additional five days if you have been fever free with out fever reducing medications for 24 hours and your symptoms are improving.

## 2020-07-12 NOTE — ED Notes (Signed)
An After Visit Summary was printed and given to the patient. Discharge instructions given and no further questions at this time.  

## 2020-07-12 NOTE — ED Triage Notes (Signed)
Patient c/o fever, chills, and N/v since 0300 today.

## 2020-07-12 NOTE — ED Notes (Signed)
Pt currently denied needing anything at this time prior to exit.

## 2020-07-12 NOTE — ED Provider Notes (Signed)
Emergency Medicine Provider Triage Evaluation Note  Ashley Wells , a 30 y.o. female  was evaluated in triage.  Pt complains of approximately 15-hour history of fever, sinus pressure, cough, shortness of breath, myalgias and rhinorrhea.  1 dose of ibuprofen couple of hours ago.  Denies any vision changes.  Reports nausea and vomiting.  No sick contacts with similar symptoms.  Review of Systems  Positive: Fever, cough, shortness of breath, myalgias, rhinorrhea Negative: Neck stiffness  Physical Exam  BP 117/84 (BP Location: Left Arm)   Pulse 87   Temp (!) 100.6 F (38.1 C) (Oral)   Resp 14   Ht 5\' 2"  (1.575 m)   Wt 45.4 kg   LMP 07/12/2020   SpO2 100%   BMI 18.29 kg/m  Gen:   Awake, no distress   Resp:  Normal effort  MSK:   Moves extremities without difficulty   Medical Decision Making  Medically screening exam initiated at 7:14 PM.  Appropriate orders placed.  07/14/2020 Ashley Wells was informed that the remainder of the evaluation will be completed by another provider, this initial triage assessment does not replace that evaluation, and the importance of remaining in the ED until their evaluation is complete.  Will obtain chest x-ray and may need COVID testing.  Tylenol antiemetic given   Ashley [place], Dietrich Pates 07/12/20 1918    07/14/20, MD 07/15/20 [phone]

## 2020-07-12 NOTE — ED Provider Notes (Signed)
[facility]-EMERGENCY DEPT Provider Note   CSN: [id number] Arrival date & time: 07/12/20  1843     History Chief Complaint  Patient presents with  . Fever  . Nausea  . Emesis    Ashley Wells is a 30 y.o. female with a past medical history of asthma, not on any daily medications who presents today for evaluation of fever, sinus pressure, cough, shortness of breath, myalgias, rhinorrhea, headache, nausea, vomiting and generally feeling unwell.  This woke her up at 3 AM this morning.  She was feeling well when she went to bed last night.  She did have 2 COVID vaccines however her last 1 was over a year ago and she did not get boosted.  She denies any known COVID contacts. She was given Tylenol and Zofran in triage.  She states that she had ibuprofen a few hours ago.  She denies any focal pain or abdominal pain.  She does have a headache however reports that she has not struck her head, does not have any weakness, and states that her head does not hurt more than the rest of her body. She denies specific chest pain or leg swelling.  She had a COVID test obtained earlier today, does not yet have results.  HPI     Past Medical History:  Diagnosis Date  . Asthma   . Migraine   . Renal disorder     There are no problems to display for this patient.   History reviewed. No pertinent surgical history.   OB History   No obstetric history on file.     Family History  Problem Relation Age of Onset  . Multiple sclerosis Mother   . Asthma Mother   . Asthma Father     Social History   Tobacco Use  . Smoking status: Former Smoker    Packs/day: 0.25    Types: Cigarettes  . Smokeless tobacco: Never Used  Vaping Use  . Vaping Use: Never used  Substance Use Topics  . Alcohol use: Yes    Comment: occasionally  . Drug use: Not Currently    Types: Marijuana    Comment: weekends    Home Medications Prior to Admission medications   Medication Sig Start  Date End Date Taking? Authorizing Provider  ondansetron (ZOFRAN ODT) 4 MG disintegrating tablet Take 1 tablet (4 mg total) by mouth every 8 (eight) hours as needed for nausea or vomiting. 07/12/20  Yes Cristina Gong, PA-C  albuterol (PROVENTIL HFA;VENTOLIN HFA) 108 (90 Base) MCG/ACT inhaler Inhale 2 puffs into the lungs every 4 (four) hours as needed for wheezing or shortness of breath.    [provider]  albuterol (PROVENTIL) (5 MG/ML) 0.5% nebulizer solution Take 5 mg by nebulization every 6 (six) hours as needed for wheezing or shortness of breath.    [provider]  doxycycline (VIBRAMYCIN) 100 MG capsule Take 1 capsule (100 mg total) by mouth 2 (two) times daily. 12/14/17   Khatri, Hina, PA-C  ibuprofen (ADVIL,MOTRIN) 200 MG tablet Take 600 mg by mouth every 6 (six) hours as needed for headache or mild pain.    [provider]  naproxen (NAPROSYN) 375 MG tablet Take 1 tablet (375 mg total) by mouth 2 (two) times daily. 10/15/17   Aviva Kluver B, PA-C    Allergies    Shellfish allergy  Review of Systems   Review of Systems  Constitutional: Positive for chills, fatigue and fever. Negative for diaphoresis.  HENT:  Positive for congestion, rhinorrhea and sore throat. Negative for dental problem, ear discharge, trouble swallowing and voice change.   Eyes: Negative for visual disturbance.  Respiratory: Positive for cough and shortness of breath.   Cardiovascular: Negative for chest pain, palpitations and leg swelling.  Gastrointestinal: Positive for nausea and vomiting. Negative for abdominal pain.  Genitourinary: Negative for dysuria.  Musculoskeletal: Positive for arthralgias and myalgias. Negative for neck pain and neck stiffness.  Skin: Negative for color change and rash.  Allergic/Immunologic: Negative for immunocompromised state.  Neurological: Positive for headaches. Negative for speech difficulty, weakness, light-headedness and numbness.  All other  systems reviewed and are negative.   Physical Exam Updated Vital Signs BP 121/68   Pulse 80   Temp 99.8 F (37.7 C) (Oral)   Resp 18   Ht 5\' 2"  (1.575 m)   Wt 45.4 kg   LMP 07/12/2020   SpO2 99%   BMI 18.29 kg/m   Physical Exam Vitals and nursing note reviewed.  Constitutional:      General: She is not in acute distress.    Appearance: She is not ill-appearing.  HENT:     Head: Atraumatic.     Mouth/Throat:     Mouth: Mucous membranes are moist.     Pharynx: Oropharynx is clear. No oropharyngeal exudate or posterior oropharyngeal erythema.  Eyes:     Conjunctiva/sclera: Conjunctivae normal.  Cardiovascular:     Rate and Rhythm: Normal rate.     Pulses: Normal pulses.     Heart sounds: Normal heart sounds.  Pulmonary:     Effort: Pulmonary effort is normal. No respiratory distress.     Breath sounds: Normal breath sounds. No stridor. No wheezing or rhonchi.  Abdominal:     General: There is no distension.     Tenderness: There is no abdominal tenderness. There is no guarding.  Musculoskeletal:     Cervical back: Normal range of motion and neck supple.     Comments: No obvious acute injury  Skin:    General: Skin is warm.  Neurological:     Mental Status: She is alert.     Comments: Awake and alert, answers all questions appropriately.  Speech is not slurred.  Coordination is grossly intact, able to ambulate at bedside for ambulatory saturations without ataxia, obvious weakness or difficulty.  Psychiatric:        Mood and Affect: Mood normal.        Behavior: Behavior normal.     ED Results / Procedures / Treatments   Labs (all labs ordered are listed, but only abnormal results are displayed) Labs Reviewed - No data to display  EKG None  Radiology DG Chest 2 View  Result Date: 07/12/2020 CLINICAL DATA:  Dyspnea, fever, chills EXAM: CHEST - 2 VIEW COMPARISON:  11/11/2019 FINDINGS: The heart size and mediastinal contours are within normal limits. Both lungs  are clear. The visualized skeletal structures are unremarkable. IMPRESSION: No active cardiopulmonary disease. Electronically Signed   By: 11/13/2019 MD   On: 07/12/2020 19:42    Procedures Procedures   Medications Ordered in ED Medications  acetaminophen (TYLENOL) tablet 650 mg (650 mg Oral Given 07/12/20 1912)  ondansetron (ZOFRAN-ODT) disintegrating tablet 4 mg (4 mg Oral Given 07/12/20 2000)  albuterol (VENTOLIN HFA) 108 (90 Base) MCG/ACT inhaler 2 puff (2 puffs Inhalation Given 07/12/20 2138)  aerochamber Z-Stat Plus/medium 1 each (1 each Other Given 07/12/20 2138)    ED Course  I have reviewed the triage vital  signs and the nursing notes.  Pertinent labs & imaging results that were available during my care of the patient were reviewed by me and considered in my medical decision making (see chart for details).    MDM Rules/Calculators/A&P                         Autry Droege Hochstein was evaluated in Emergency Department on 07/13/2020 for the symptoms described in the history of present illness. She was evaluated in the context of the global COVID-19 pandemic, which necessitated consideration that the patient might be at risk for infection with the SARS-CoV-2 virus that causes COVID-19. Institutional protocols and algorithms that pertain to the evaluation of patients at risk for COVID-19 are in a state of rapid change based on information released by regulatory bodies including the CDC and federal and state organizations. These policies and algorithms were followed during the patient's care in the ED.  Patient is a 30 year old woman who presents today for evaluation of sudden onset of multiple symptoms including fevers, chills, nausea/vomiting, myalgias, arthralgias, cough, and headache that started this morning.  She has had 2 COVID vaccines however is not listed. Here she is able to ambulate with her oxygen remaining 100% on room air.  She was febrile on arrival, was treated with Tylenol  and given Zofran, after this she stated that she felt better.  She notes that when she coughs she feels wheezy, she does not have wheezes auscultated at rest.  Albuterol as ordered.  She has a outpatient COVID test pending.  I advised her that even if this returns negative she needs to quarantine appropriately assuming she may have COVID.  X-ray without active abnormalities. Given that she does have shortness of breath EKG is ordered showing no acute abnormalities. .    At this time she does not have a positive covid test and therefore does not meet criteria for MAB.   Return precautions were discussed with patient who states their understanding.  At the time of discharge patient denied any unaddressed complaints or concerns.  Patient is agreeable for discharge home.  Note: Portions of this report may have been transcribed using voice recognition software. Every effort was made to ensure accuracy; however, inadvertent computerized transcription errors may be present   Final Clinical Impression(s) / ED Diagnoses Final diagnoses:  Suspected COVID-19 virus infection    Rx / DC Orders ED Discharge Orders         Ordered    ondansetron (ZOFRAN ODT) 4 MG disintegrating tablet  Every 8 hours PRN        07/12/20 2132           Cristina Gong, PA-C 07/13/20 0030    Bethann Berkshire, MD 07/13/20 1500

## 2020-08-11 ENCOUNTER — Encounter (HOSPITAL_COMMUNITY): Payer: Self-pay | Admitting: Emergency Medicine

## 2020-08-11 ENCOUNTER — Emergency Department (HOSPITAL_COMMUNITY)
Admission: EM | Admit: 2020-08-11 | Discharge: 2020-08-11 | Disposition: A | Discharge status: Home / Self Care | Payer: Self-pay | Attending: Emergency Medicine | Admitting: Emergency Medicine

## 2020-08-11 ENCOUNTER — Other Ambulatory Visit: Payer: Self-pay

## 2020-08-11 DIAGNOSIS — R300 Dysuria: Secondary | ICD-10-CM | POA: Insufficient documentation

## 2020-08-11 DIAGNOSIS — R109 Unspecified abdominal pain: Secondary | ICD-10-CM | POA: Insufficient documentation

## 2020-08-11 DIAGNOSIS — Z87891 Personal history of nicotine dependence: Secondary | ICD-10-CM | POA: Insufficient documentation

## 2020-08-11 DIAGNOSIS — R062 Wheezing: Secondary | ICD-10-CM | POA: Insufficient documentation

## 2020-08-11 DIAGNOSIS — R110 Nausea: Secondary | ICD-10-CM | POA: Insufficient documentation

## 2020-08-11 LAB — CBC WITH DIFFERENTIAL/PLATELET
Abs Immature Granulocytes: 0.01 10*3/uL (ref 0.00–0.07)
Basophils Absolute: 0.1 10*3/uL (ref 0.0–0.1)
Basophils Relative: 1 %
Eosinophils Absolute: 0.4 10*3/uL (ref 0.0–0.5)
Eosinophils Relative: 6 %
HCT: 33.2 % — ABNORMAL LOW (ref 36.0–46.0)
Hemoglobin: 10.8 g/dL — ABNORMAL LOW (ref 12.0–15.0)
Immature Granulocytes: 0 %
Lymphocytes Relative: 33 %
Lymphs Abs: 2.6 10*3/uL (ref 0.7–4.0)
MCH: 28.4 pg (ref 26.0–34.0)
MCHC: 32.5 g/dL (ref 30.0–36.0)
MCV: 87.4 fL (ref 80.0–100.0)
Monocytes Absolute: 0.5 10*3/uL (ref 0.1–1.0)
Monocytes Relative: 6 %
Neutro Abs: 4.2 10*3/uL (ref 1.7–7.7)
Neutrophils Relative %: 54 %
Platelets: 183 10*3/uL (ref 150–400)
RBC: 3.8 MIL/uL — ABNORMAL LOW (ref 3.87–5.11)
RDW: 14.1 % (ref 11.5–15.5)
WBC: 7.8 10*3/uL (ref 4.0–10.5)
nRBC: 0 % (ref 0.0–0.2)

## 2020-08-11 LAB — I-STAT BETA HCG BLOOD, ED (MC, WL, AP ONLY): I-stat hCG, quantitative: <5 m[IU]/mL (ref <5)

## 2020-08-11 LAB — COMPREHENSIVE METABOLIC PANEL
ALT: 16 U/L (ref 0–44)
AST: 17 U/L (ref 15–41)
Albumin: 4.1 g/dL (ref 3.5–5.0)
Alkaline Phosphatase: 65 U/L (ref 38–126)
Anion gap: 8 (ref 5–15)
BUN: 14 mg/dL (ref 6–20)
CO2: 22 mmol/L (ref 22–32)
Calcium: 9.2 mg/dL (ref 8.9–10.3)
Chloride: 106 mmol/L (ref 98–111)
Creatinine, Ser: 0.69 mg/dL (ref 0.44–1.00)
GFR, Estimated: >60 mL/min (ref >60)
Glucose, Bld: 85 mg/dL (ref 70–99)
Potassium: 3.8 mmol/L (ref 3.5–5.1)
Sodium: 136 mmol/L (ref 135–145)
Total Bilirubin: 0.3 mg/dL (ref 0.3–1.2)
Total Protein: 7.2 g/dL (ref 6.5–8.1)

## 2020-08-11 LAB — URINALYSIS, ROUTINE W REFLEX MICROSCOPIC
Bilirubin Urine: NEGATIVE
Glucose, UA: NEGATIVE mg/dL
Hgb urine dipstick: NEGATIVE
Ketones, ur: 5 mg/dL — AB
Leukocytes,Ua: NEGATIVE
Nitrite: NEGATIVE
Protein, ur: NEGATIVE mg/dL
Specific Gravity, Urine: 1.029 (ref 1.005–1.030)
pH: 6 (ref 5.0–8.0)

## 2020-08-11 NOTE — ED Provider Notes (Signed)
Emergency Medicine Provider Triage Evaluation Note  Ashley Wells , a 30 y.o. female  was evaluated in triage.  Pt complains of lower abd pain.  Review of Systems  Positive: Lower abd pain, worse with movement Negative: Fever, cp, sob, dysuria, vaginal bleeding, n/v/d.   Physical Exam  BP 109/64 (BP Location: Left Arm)   Pulse 89   Temp 98.5 F (36.9 C) (Oral)   Resp 16   LMP 07/12/2020   SpO2 98%  Gen:   Awake, no distress   Resp:  Normal effort  MSK:   Moves extremities without difficulty  Other:    Medical Decision Making  Medically screening exam initiated at 6:06 PM.  Appropriate orders placed.  Ashley Charleston Bathe was informed that the remainder of the evaluation will be completed by another provider, this initial triage assessment does not replace that evaluation, and the importance of remaining in the ED until their evaluation is complete.  Pain to R side of abdomen for the past few days, worsen with movement.  Felt similar to prior ruptured ovarian cyst .   Fayrene Helper, PA-C 08/11/20 1807    Horton, Clabe Seal, DO 08/11/20 2224

## 2020-08-11 NOTE — Discharge Instructions (Signed)
  Walk-ins for certain complaints available at:   [facility] [address]  [phone]  See hours at [email or web address]   [facility] [employer] [facility] [employer] [facility]  [address]  [phone]   [facility] [employer] [facility] [employer]  [phone]   You can make an appointment to see a GYN provider:   [facility] for [facility] at [facility]  [address]  [phone]   [facility] for [facility] at [facility]  [address] [employer]  [phone]   [facility] for [facility] at [facility]  [phone]   [facility] for [facility] at [facility]  [address]  [phone]   If you already have an established OB/GYN provider in the area you can make an appointment with them as well.

## 2020-08-11 NOTE — ED Triage Notes (Signed)
Pt states that she has R sided flank pain. States that she had the same pain when she had an ovarian cyst years ago. States she is off of the birth control that was helping the cysts and she is 5 days late for her period. Alert and oriented.

## 2020-08-11 NOTE — ED Provider Notes (Signed)
[facility]-EMERGENCY DEPT Provider Note   CSN: [id number] Arrival date & time: 08/11/20  1717     History Chief Complaint  Patient presents with   Flank Pain    Ashley Wells is a 30 y.o. female.  30 year old female presents with complaint of right mid abdominal pain x3 days, felt to be similar to prior ovarian cyst.  Pain is worse with walking, associated with nausea as well as dysuria.  Denies fevers, vomiting, abnormal vaginal discharge.  States that she is 4 days late for her menstrual cycle, sexually active, not on birth control could possibly be pregnant.  No prior abdominal surgeries.  No other complaints or concerns.      Past Medical History:  Diagnosis Date   Asthma    Migraine    Renal disorder     There are no problems to display for this patient.   History reviewed. No pertinent surgical history.   OB History   No obstetric history on file.     Family History  Problem Relation Age of Onset   Multiple sclerosis Mother    Asthma Mother    Asthma Father     Social History   Tobacco Use   Smoking status: Former    Packs/day: 0.25    Pack years: 0.00    Types: Cigarettes   Smokeless tobacco: Never  Vaping Use   Vaping Use: Never used  Substance Use Topics   Alcohol use: Yes    Comment: occasionally   Drug use: Not Currently    Types: Marijuana    Comment: weekends    Home Medications Prior to Admission medications   Medication Sig Start Date End Date Taking? Authorizing Provider  albuterol (PROVENTIL HFA;VENTOLIN HFA) 108 (90 Base) MCG/ACT inhaler Inhale 2 puffs into the lungs every 4 (four) hours as needed for wheezing or shortness of breath.   Yes [provider]  albuterol (PROVENTIL) (5 MG/ML) 0.5% nebulizer solution Take 5 mg by nebulization every 6 (six) hours as needed for wheezing or shortness of breath.   Yes [provider]  ibuprofen (ADVIL,MOTRIN) 200 MG tablet Take 400 mg by mouth every  6 (six) hours as needed for headache or mild pain.   Yes [provider]  Melatonin 12 MG TABS Take 12 mg by mouth daily as needed (sleep).   Yes [provider]  ondansetron (ZOFRAN ODT) 4 MG disintegrating tablet Take 1 tablet (4 mg total) by mouth every 8 (eight) hours as needed for nausea or vomiting. 07/12/20  Yes Cristina Gong, PA-C  oxymetazoline (AFRIN) 0.05 % nasal spray Place 1 spray into both nostrils daily as needed for congestion.   Yes [provider]  doxycycline (VIBRAMYCIN) 100 MG capsule Take 1 capsule (100 mg total) by mouth 2 (two) times daily. Patient not taking: No sig reported 12/14/17   Khatri, Hina, PA-C  naproxen (NAPROSYN) 375 MG tablet Take 1 tablet (375 mg total) by mouth 2 (two) times daily. Patient not taking: Reported on 08/11/2020 10/15/17   Aviva Kluver B, PA-C    Allergies    Shellfish allergy  Review of Systems   Review of Systems  Constitutional:  Negative for chills, diaphoresis and fever.  Respiratory:  Negative for shortness of breath.   Cardiovascular:  Negative for chest pain.  Gastrointestinal:  Positive for abdominal pain and nausea. Negative for constipation, diarrhea and vomiting.  Genitourinary:  Positive for dysuria. Negative for hematuria, vaginal bleeding and vaginal discharge.  Musculoskeletal:  Negative for arthralgias and myalgias.  Skin:  Negative for rash and wound.  Allergic/Immunologic: Negative for immunocompromised state.  Neurological:  Negative for weakness.  Hematological:  Negative for adenopathy.  Psychiatric/Behavioral:  Negative for confusion.   All other systems reviewed and are negative.  Physical Exam Updated Vital Signs BP 117/67   Pulse 62   Temp 99 F (37.2 C) (Oral)   Resp 16   LMP 07/12/2020   SpO2 100%   Physical Exam Vitals and nursing note reviewed.  Constitutional:      General: She is not in acute distress.    Appearance: She is well-developed. She is not  diaphoretic.  HENT:     Head: Normocephalic and atraumatic.  Cardiovascular:     Rate and Rhythm: Normal rate and regular rhythm.     Heart sounds: Normal heart sounds.  Pulmonary:     Effort: Pulmonary effort is normal.     Breath sounds: Wheezing present.  Abdominal:     Palpations: Abdomen is soft.     Tenderness: There is abdominal tenderness. There is no right CVA tenderness or left CVA tenderness. Negative signs include [name]'s sign.    Musculoskeletal:     Right lower leg: No edema.     Left lower leg: No edema.  Skin:    General: Skin is warm and dry.     Findings: No erythema or rash.  Neurological:     Mental Status: She is alert and oriented to person, place, and time.  Psychiatric:        Behavior: Behavior normal.    ED Results / Procedures / Treatments   Labs (all labs ordered are listed, but only abnormal results are displayed) Labs Reviewed  URINALYSIS, ROUTINE W REFLEX MICROSCOPIC - Abnormal; Notable for the following components:      Result Value   Ketones, ur 5 (*)    All other components within normal limits  CBC WITH DIFFERENTIAL/PLATELET - Abnormal; Notable for the following components:   RBC 3.80 (*)    Hemoglobin 10.8 (*)    HCT 33.2 (*)    All other components within normal limits  COMPREHENSIVE METABOLIC PANEL  I-STAT BETA HCG BLOOD, ED ([facility], [facility], [facility] ONLY)    EKG None  Radiology No results found.  Procedures Procedures   Medications Ordered in ED Medications - No data to display  ED Course  I have reviewed the triage vital signs and the nursing notes.  Pertinent labs & imaging results that were available during my care of the patient were reviewed by me and considered in my medical decision making (see chart for details).  Clinical Course as of 08/11/20 2237  Thu Aug 11, 2020  [id number] 30 year old female with complaint of right mid abdominal pain for the past 3 days also with dysuria and nausea.  On exam, no CVA tenderness, found to  have right mid abdominal tenderness negative Essynce Munsch sign no lower abdominal pain. hCG is negative, urinalysis with ketones otherwise unremarkable.  CBC and CMP without significant findings.  Patient is well-appearing otherwise, doubt torsion, ectopic ruled out with negative pregnancy test, review of CT scan from 4 months ago, no kidney stones at that time, doubt stone today.  Advised return to the ED for new or worsening symptoms otherwise can follow-up with gynecology clinic for late menstrual cycle if she does not start treatment/concern for ovarian cyst. [LM]    Clinical Course User Index [LM] Alden Hipp   MDM  Rules/Calculators/A&P                           Final Clinical Impression(s) / ED Diagnoses Final diagnoses:  Right flank pain    Rx / DC Orders ED Discharge Orders     None        Alden Hipp 08/11/20 2237    Lorre Nick, MD 08/11/20 [phone]

## 2020-08-12 ENCOUNTER — Telehealth: Payer: Self-pay

## 2020-09-26 ENCOUNTER — Ambulatory Visit: Payer: Self-pay | Admitting: Nurse Practitioner

## 2020-10-11 NOTE — Telephone Encounter (Signed)
Can you please and thank you cancel appointment oer patient request.  thanks.   Dm/cma

## 2020-10-12 ENCOUNTER — Ambulatory Visit: Payer: Self-pay | Admitting: Nurse Practitioner

## 2021-01-31 ENCOUNTER — Other Ambulatory Visit: Payer: Self-pay

## 2021-01-31 ENCOUNTER — Emergency Department (HOSPITAL_COMMUNITY)
Admission: EM | Admit: 2021-01-31 | Discharge: 2021-02-01 | Disposition: A | Discharge status: Home / Self Care | Payer: Self-pay | Attending: Emergency Medicine | Admitting: Emergency Medicine

## 2021-01-31 ENCOUNTER — Encounter (HOSPITAL_COMMUNITY): Payer: Self-pay

## 2021-01-31 ENCOUNTER — Emergency Department (HOSPITAL_COMMUNITY): Payer: Self-pay

## 2021-01-31 DIAGNOSIS — R1030 Lower abdominal pain, unspecified: Secondary | ICD-10-CM | POA: Insufficient documentation

## 2021-01-31 DIAGNOSIS — Z5321 Procedure and treatment not carried out due to patient leaving prior to being seen by health care provider: Secondary | ICD-10-CM | POA: Insufficient documentation

## 2021-01-31 LAB — COMPREHENSIVE METABOLIC PANEL
ALT: 12 U/L (ref 0–44)
AST: 18 U/L (ref 15–41)
Albumin: 4.4 g/dL (ref 3.5–5.0)
Alkaline Phosphatase: 50 U/L (ref 38–126)
Anion gap: 7 (ref 5–15)
BUN: 12 mg/dL (ref 6–20)
CO2: 25 mmol/L (ref 22–32)
Calcium: 9.4 mg/dL (ref 8.9–10.3)
Chloride: 104 mmol/L (ref 98–111)
Creatinine, Ser: 0.57 mg/dL (ref 0.44–1.00)
GFR, Estimated: >60 mL/min (ref >60)
Glucose, Bld: 101 mg/dL — ABNORMAL HIGH (ref 70–99)
Potassium: 3.9 mmol/L (ref 3.5–5.1)
Sodium: 136 mmol/L (ref 135–145)
Total Bilirubin: 0.5 mg/dL (ref 0.3–1.2)
Total Protein: 7.5 g/dL (ref 6.5–8.1)

## 2021-01-31 LAB — CBC WITH DIFFERENTIAL/PLATELET
Abs Immature Granulocytes: 0.02 10*3/uL (ref 0.00–0.07)
Basophils Absolute: 0.1 10*3/uL (ref 0.0–0.1)
Basophils Relative: 1 %
Eosinophils Absolute: 0.2 10*3/uL (ref 0.0–0.5)
Eosinophils Relative: 2 %
HCT: 32.1 % — ABNORMAL LOW (ref 36.0–46.0)
Hemoglobin: 10.5 g/dL — ABNORMAL LOW (ref 12.0–15.0)
Immature Granulocytes: 0 %
Lymphocytes Relative: 25 %
Lymphs Abs: 1.9 10*3/uL (ref 0.7–4.0)
MCH: 28.8 pg (ref 26.0–34.0)
MCHC: 32.7 g/dL (ref 30.0–36.0)
MCV: 87.9 fL (ref 80.0–100.0)
Monocytes Absolute: 0.5 10*3/uL (ref 0.1–1.0)
Monocytes Relative: 6 %
Neutro Abs: 5 10*3/uL (ref 1.7–7.7)
Neutrophils Relative %: 66 %
Platelets: 263 10*3/uL (ref 150–400)
RBC: 3.65 MIL/uL — ABNORMAL LOW (ref 3.87–5.11)
RDW: 14.3 % (ref 11.5–15.5)
WBC: 7.5 10*3/uL (ref 4.0–10.5)
nRBC: 0 % (ref 0.0–0.2)

## 2021-01-31 LAB — I-STAT BETA HCG BLOOD, ED (MC, WL, AP ONLY): I-stat hCG, quantitative: <5 m[IU]/mL (ref <5)

## 2021-01-31 MED ORDER — HYDROCODONE-ACETAMINOPHEN 5-325 MG PO TABS: 1.0000 | ORAL_TABLET | Freq: Once | ORAL | Status: DC

## 2021-01-31 NOTE — ED Provider Notes (Signed)
Emergency Medicine Provider Triage Evaluation Note  Ashley Wells , a 30 y.o. female  was evaluated in triage.  Pt complains of suprapubic pain radiating into RLQ. Hx of known ovarian cysts with recurrent pain. Patient reports pain transitioned from intermitted to constant over the last week. 9/10 pain. No relief with ibuprofen, advil, tylenol. Does endorse chills, dysuria, nausea. Denies vomiting, diarrhea, hematuria, LOC. Denies vaginal discharge, dyspareunia.  Review of Systems  Positive: As above Negative: As above  Physical Exam  BP 110/71    Pulse 63    Temp 98.1 F (36.7 C)    Resp 16    Ht 5\' 2"  (1.575 m)    Wt 45.8 kg    SpO2 99%    BMI 18.45 kg/m  Gen:   Awake, no distress   Resp:  Normal effort  MSK:   Moves extremities without difficulty  Other:  TTP suprapubic, RLQ / flank  Medical Decision Making  Medically screening exam initiated at 9:06 PM.  Appropriate orders placed.  Eiben was informed that the remainder of the evaluation will be completed by another provider, this initial triage assessment does not replace that evaluation, and the importance of remaining in the ED until their evaluation is complete.  R/o torsion ordered   Ashley Charleston 01/31/21 2109    2110, MD 01/31/21 [phone]

## 2021-01-31 NOTE — ED Triage Notes (Addendum)
Pt. Arrives POV c/o pain from ovarian cyst that occurs in her right side and lower abdomen x1 week

## 2021-03-05 ENCOUNTER — Telehealth: Payer: Self-pay | Admitting: Nurse Practitioner

## 2021-03-05 ENCOUNTER — Other Ambulatory Visit: Payer: Self-pay | Admitting: Nurse Practitioner

## 2021-03-05 DIAGNOSIS — J45909 Unspecified asthma, uncomplicated: Secondary | ICD-10-CM

## 2021-03-05 MED ORDER — ALBUTEROL SULFATE HFA 108 (90 BASE) MCG/ACT IN AERS: 2.0000 | INHALATION_SPRAY | Freq: Four times a day (QID) | RESPIRATORY_TRACT | 0 refills | Status: DC | PRN

## 2021-03-05 NOTE — Progress Notes (Signed)
Visit for Asthma ° °Based on what you have shared with me, it looks like you may have a flare up of your asthma.  Asthma is a chronic (ongoing) lung disease which results in airway obstruction, inflammation and hyper-responsiveness.  ° °Asthma symptoms vary from person to person, with common symptoms including nighttime awakening and decreased ability to participate in normal activities as a result of shortness of breath. It is often triggered by changes in weather, changes in the season, changes in air temperature, or inside (home, school, daycare or work) allergens such as animal dander, mold, mildew, woodstoves or cockroaches.   It can also be triggered by hormonal changes, extreme emotion, physical exertion or an upper respiratory tract illness.    ° °It is important to identify the trigger, and then eliminate or avoid the trigger if possible.  ° °If you have been prescribed medications to be taken on a regular basis, it is important to follow the asthma action plan and to follow guidelines to adjust medication in response to increasing symptoms of decreased peak expiratory flow rate ° °Treatment: °I have prescribed: Albuterol (Proventil HFA; Ventolin HFA) 108 (90 Base) MCG/ACT Inhaler 2 puffs into the lungs every six hours as needed for wheezing or shortness of breath ° °HOME CARE °Only take medications as instructed by your medical team. °Consider wearing a mask or scarf to improve breathing air temperature have been shown to decrease irritation and decrease exacerbations °Get rest. °Taking a steamy shower or using a humidifier may help nasal congestion sand ease sore throat pain. You can place a towel over your head and breathe in the steam from hot water coming from a faucet. °Using a saline nasal spray works much the same way.  °Cough drops, hare candies and sore throat lozenges may ease your  cough.  °Avoid close contacts especially the very you and the elderly °Cover your mouth if you cough or sneeze °Always remember to wash your hands.  ° ° °GET HELP RIGHT AWAY IF: °You develop worsening symptoms; breathlessness at rest, drowsy, confused or agitated, unable to speak in full sentences °You have coughing fits °You develop a severe headache or visual changes °You develop shortness of breath, difficulty breathing or start having chest pain °Your symptoms persist after you have completed your treatment plan °If your symptoms do not improve within 10 days ° °MAKE SURE YOU °Understand these instructions. °Will watch your condition. °Will get help right away if you are not doing well or get worse.  ° °Your e-visit answers were reviewed by a board certified advanced clinical practitioner to complete your personal care plan, Depending upon the condition, your plan could have included both over the counter or prescription medications.  ° °Please review your pharmacy choice. °Your safety is important to us. If you have drug allergies check your prescription carefully. ° °You can use MyChart to ask questions about today's visit, request a non-urgent  °call back, or ask for a work or school excuse for 24 hours related to this e-Visit. If it has been greater than 24 hours you will need to follow up with your provider, or enter a new e-Visit to address those concerns.  ° °You will get an e-mail in the next two days asking about your experience. I hope that your e-visit has been valuable and will speed your recovery. Thank you for using e-visits.  ° °I have spent at least 5 minutes reviewing and documenting in the patient's chart.  °

## 2021-03-23 ENCOUNTER — Telehealth: Payer: Self-pay | Admitting: Physician Assistant

## 2021-03-23 DIAGNOSIS — M7989 Other specified soft tissue disorders: Secondary | ICD-10-CM

## 2021-03-23 MED ORDER — MELOXICAM 15 MG PO TABS: 15.0000 mg | ORAL_TABLET | Freq: Every day | ORAL | 0 refills | Status: DC

## 2021-03-23 NOTE — Progress Notes (Signed)
Virtual Visit Consent   Ashley Wells, you are scheduled for a virtual visit with a [facility] provider today.     Just as with appointments in the office, your consent must be obtained to participate.  Your consent will be active for this visit and any virtual visit you may have with one of our providers in the next 365 days.     If you have a MyChart account, a copy of this consent can be sent to you electronically.  All virtual visits are billed to your insurance company just like a traditional visit in the office.    As this is a virtual visit, video technology does not allow for your provider to perform a traditional examination.  This may limit your provider's ability to fully assess your condition.  If your provider identifies any concerns that need to be evaluated in person or the need to arrange testing (such as labs, EKG, etc.), we will make arrangements to do so.     Although advances in technology are sophisticated, we cannot ensure that it will always work on either your end or our end.  If the connection with a video visit is poor, the visit may have to be switched to a telephone visit.  With either a video or telephone visit, we are not always able to ensure that we have a secure connection.     I need to obtain your verbal consent now.   Are you willing to proceed with your visit today?    Ashley Wells has provided verbal consent on 03/23/2021 for a virtual visit (video or telephone).   Piedad Climes, [place]   Date: 03/23/2021 7:54 PM   Virtual Visit via Video Note   I, Piedad Climes, connected with  Ashley Wells  ([id number], Dec 04, 1990) on 03/23/21 at  7:45 PM EST by a video-enabled telemedicine application and verified that I am speaking with the correct person using two identifiers.  Location: Patient: Virtual Visit Location Patient: Home Provider: Virtual Visit Location Provider: Home Office   I discussed the limitations of evaluation and  management by telemedicine and the availability of in person appointments. The patient expressed understanding and agreed to proceed.    History of Present Illness: Ashley Wells is a 31 y.o. who identifies as a female who was assigned female at birth, and is being seen today for some pain and mild swelling of the dorsum of R hand, just inferior to 2nd and 3rd MCP. Denies any trauma or injury. Denies redness, warmth or bruising. Denies weakness of hand. Notes pain with lifting, especially if fingers are dorsiflexed.  Denies symptoms of L hand. 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 HPI: HPI  Problems: There are no problems to display for this patient.   Allergies:  Allergies  Allergen Reactions   Shellfish Allergy Anaphylaxis and Hives   Medications:  Current Outpatient Medications:    albuterol (PROVENTIL HFA) 108 (90 Base) MCG/ACT inhaler, Inhale 2 puffs into the lungs every 6 (six) hours as needed for wheezing or shortness of breath., Disp: 1 each, Rfl: 0   albuterol (PROVENTIL HFA;VENTOLIN HFA) 108 (90 Base) MCG/ACT inhaler, Inhale 2 puffs into the lungs every 4 (four) hours as needed for wheezing or shortness of breath., Disp: , Rfl:    albuterol (PROVENTIL) (5 MG/ML) 0.5% nebulizer solution, Take 5 mg by nebulization every 6 (six) hours as needed for wheezing or shortness of  breath., Disp: , Rfl:    Melatonin 12 MG TABS, Take 12 mg by mouth daily as needed (sleep)., Disp: , Rfl:   Observations/Objective: Patient is well-developed, well-nourished in no acute distress.  Resting comfortably at home.  Head is normocephalic, atraumatic.  No labored breathing. Speech is clear and coherent with logical content.  Patient is alert and oriented at baseline. .  R hand with normal ROM of fingers and wrist demonstrated by patient. No appreciable visible swelling at time of video visit. .  Assessment and Plan: 1. Swelling of right hand  Focal area of  endorsed swelling and discomfort. No alarm signs/symptoms. Suspect overuse injury. Will have her rest tomorrow. Start ACE wrap for compression. Meloxicam once daily for 4-5 days with food. Tylenol for breakthrough pain. In-person follow-up precautions discussed.   Follow Up Instructions: I discussed the assessment and treatment plan with the patient. The patient was provided an opportunity to ask questions and all were answered. The patient agreed with the plan and demonstrated an understanding of the instructions.  A copy of instructions were sent to the patient via MyChart unless otherwise noted below.   The patient was advised to call back or seek an in-person evaluation if the symptoms worsen or if the condition fails to improve as anticipated.  Time:  I spent 10 minutes with the patient via telehealth technology discussing the above problems/concerns.    Piedad Climes, PA-C

## 2021-03-23 NOTE — Patient Instructions (Signed)
°  Ashley Wells, thank you for joining [name], PA-C for today's virtual visit.  While this provider is not your primary care provider (PCP), if your PCP is located in our provider database this encounter information will be shared with them immediately following your visit.  Consent: (Patient) Ashley Wells provided verbal consent for this virtual visit at the beginning of the encounter.  Current Medications:  Current Outpatient Medications:    meloxicam (MOBIC) 15 MG tablet, Take 1 tablet (15 mg total) by mouth daily., Disp: 15 tablet, Rfl: 0   albuterol (PROVENTIL HFA) 108 (90 Base) MCG/ACT inhaler, Inhale 2 puffs into the lungs every 6 (six) hours as needed for wheezing or shortness of breath., Disp: 1 each, Rfl: 0   albuterol (PROVENTIL HFA;VENTOLIN HFA) 108 (90 Base) MCG/ACT inhaler, Inhale 2 puffs into the lungs every 4 (four) hours as needed for wheezing or shortness of breath., Disp: , Rfl:    albuterol (PROVENTIL) (5 MG/ML) 0.5% nebulizer solution, Take 5 mg by nebulization every 6 (six) hours as needed for wheezing or shortness of breath., Disp: , Rfl:    Melatonin 12 MG TABS, Take 12 mg by mouth daily as needed (sleep)., Disp: , Rfl:    Medications ordered in this encounter:  Meds ordered this encounter  Medications   meloxicam (MOBIC) 15 MG tablet    Sig: Take 1 tablet (15 mg total) by mouth daily.    Dispense:  15 tablet    Refill:  0    Order Specific Question:   Supervising Provider    Answer:   Sabra Heck, BRIAN [[id number]]     *If you need refills on other medications prior to your next appointment, please contact your pharmacy*  Follow-Up: Call back or seek an in-person evaluation if the symptoms worsen or if the condition fails to improve as anticipated.  Other Instructions Please avoid heavy lifting when possible. Rest tomorrow and keep the hand elevated.  Take the Meloxicam once daily with food. Tylenol for breakthrough pain. Use an ACE wrap as  discussed for compression.   Symptoms should ease up over the rest of the week.  If not, or if you note any new or worsening symptoms, I want you to use the link below to get set up for an evaluation at one of our Urgent Cares.    If you have been instructed to have an in-person evaluation today at a local Urgent Care facility, please use the link below. It will take you to a list of all of our available [facility], including address, phone number and hours of operation. Please do not delay care.  [facility]  If you or a family member do not have a primary care provider, use the link below to schedule a visit and establish care. When you choose a [facility] primary care physician or advanced practice provider, you gain a long-term partner in health. Find a Primary Care Provider  Learn more about [facility] in-office and virtual care options: [facility]

## 2021-04-03 ENCOUNTER — Telehealth: Payer: Self-pay | Admitting: Physician Assistant

## 2021-04-03 DIAGNOSIS — J4530 Mild persistent asthma, uncomplicated: Secondary | ICD-10-CM

## 2021-04-03 MED ORDER — ALBUTEROL SULFATE HFA 108 (90 BASE) MCG/ACT IN AERS: 1.0000 | INHALATION_SPRAY | Freq: Four times a day (QID) | RESPIRATORY_TRACT | 0 refills | Status: DC | PRN

## 2021-04-03 NOTE — Progress Notes (Signed)
Visit for Asthma  Based on what you have shared with me, it looks like you may have a flare up of your asthma.  Asthma is a chronic (ongoing) lung disease which results in airway obstruction, inflammation and hyper-responsiveness.   Asthma symptoms vary from person to person, with common symptoms including nighttime awakening and decreased ability to participate in normal activities as a result of shortness of breath. It is often triggered by changes in weather, changes in the season, changes in air temperature, or inside (home, school, daycare or work) allergens such as animal dander, mold, mildew, woodstoves or cockroaches.   It can also be triggered by hormonal changes, extreme emotion, physical exertion or an upper respiratory tract illness.     It is important to identify the trigger, and then eliminate or avoid the trigger if possible.   If you have been prescribed medications to be taken on a regular basis, it is important to follow the asthma action plan and to follow guidelines to adjust medication in response to increasing symptoms of decreased peak expiratory flow rate  Treatment: I have prescribed: Albuterol (Proventil HFA; Ventolin HFA) 108 (90 Base) MCG/ACT Inhaler 2 puffs into the lungs every six hours as needed for wheezing or shortness of breath  HOME CARE Only take medications as instructed by your medical team. Consider wearing a mask or scarf to improve breathing air temperature have been shown to decrease irritation and decrease exacerbations Get rest. Taking a steamy shower or using a humidifier may help nasal congestion sand ease sore throat pain. You can place a towel over your head and breathe in the steam from hot water coming from a faucet. Using a saline nasal spray works much the same way.  Cough drops, hare candies and sore throat lozenges may ease your  cough.  Avoid close contacts especially the very you and the elderly Cover your mouth if you cough or sneeze Always remember to wash your hands.    GET HELP RIGHT AWAY IF: You develop worsening symptoms; breathlessness at rest, drowsy, confused or agitated, unable to speak in full sentences You have coughing fits You develop a severe headache or visual changes You develop shortness of breath, difficulty breathing or start having chest pain Your symptoms persist after you have completed your treatment plan If your symptoms do not improve within 10 days  MAKE SURE YOU Understand these instructions. Will watch your condition. Will get help right away if you are not doing well or get worse.   Your e-visit answers were reviewed by a board certified advanced clinical practitioner to complete your personal care plan, Depending upon the condition, your plan could have included both over the counter or prescription medications.   Please review your pharmacy choice. Your safety is important to us. If you have drug allergies check your prescription carefully.  You can use MyChart to ask questions about today's visit, request a non-urgent  call back, or ask for a work or school excuse for 24 hours related to this e-Visit. If it has been greater than 24 hours you will need to follow up with your provider, or enter a new e-Visit to address those concerns.   You will get an e-mail in the next two days asking about your experience. I hope that your e-visit has been valuable and will speed your recovery. Thank you for using e-visits.  I provided 5 minutes of non face-to-face time during this encounter for chart review and documentation.   

## 2021-04-11 ENCOUNTER — Telehealth: Payer: Self-pay

## 2021-04-11 ENCOUNTER — Telehealth: Payer: Self-pay | Admitting: Nurse Practitioner

## 2021-04-11 DIAGNOSIS — J4521 Mild intermittent asthma with (acute) exacerbation: Secondary | ICD-10-CM

## 2021-04-11 DIAGNOSIS — M79641 Pain in right hand: Secondary | ICD-10-CM

## 2021-04-11 MED ORDER — PREDNISONE 20 MG PO TABS: 40.0000 mg | ORAL_TABLET | Freq: Every day | ORAL | 0 refills | Status: AC

## 2021-04-11 NOTE — Patient Instructions (Signed)
Asthma, Adult ?Asthma is a long-term (chronic) condition in which the airways get tight and narrow. The airways are the breathing passages that lead from the nose and mouth down into the lungs. A person with asthma will have times when symptoms get worse. These are called asthma attacks. They can cause coughing, whistling sounds when you breathe (wheezing), shortness of breath, and chest pain. They can make it hard to breathe. There is no cure for asthma, but medicines and lifestyle changes can help control it. ?There are many things that can bring on an asthma attack or make asthma symptoms worse (triggers). Common triggers include: ?Mold. ?Dust. ?Cigarette smoke. ?Cockroaches. ?Things that can cause allergy symptoms (allergens). These include animal skin flakes (dander) and pollen from trees or grass. ?Things that pollute the air. These may include household cleaners, wood smoke, smog, or chemical odors. ?Cold air, weather changes, and wind. ?Crying or laughing hard. ?Stress. ?Certain medicines or drugs. ?Certain foods such as dried fruit, potato chips, and grape juice. ?Infections, such as a cold or the flu. ?Certain medical conditions or diseases. ?Exercise or tiring activities. ?Asthma may be treated with medicines and by staying away from the things that cause asthma attacks. Types of medicines may include: ?Controller medicines. These help prevent asthma symptoms. They are usually taken every day. ?Fast-acting reliever or rescue medicines. These quickly relieve asthma symptoms. They are used as needed and provide short-term relief. ?Allergy medicines if your attacks are brought on by allergens. ?Medicines to help control the body's defense (immune) system. ?Follow these instructions at home: ?Avoiding triggers in your home ?Change your heating and air conditioning filter often. ?Limit your use of fireplaces and wood stoves. ?Get rid of pests (such as roaches and mice) and their droppings. ?Throw away plants  if you see mold on them. ?Clean your floors. Dust regularly. Use cleaning products that do not smell. ?Have someone vacuum when you are not home. Use a vacuum cleaner with a HEPA filter if possible. ?Replace carpet with wood, tile, or vinyl flooring. Carpet can trap animal skin flakes and dust. ?Use allergy-proof pillows, mattress covers, and box spring covers. ?Wash bed sheets and blankets every week in hot water. Dry them in a dryer. ?Keep your bedroom free of any triggers. ?Avoid pets and keep windows closed when things that cause allergy symptoms are in the air. ?Use blankets that are made of polyester or cotton. ?Clean bathrooms and kitchens with bleach. If possible, have someone repaint the walls in these rooms with mold-resistant paint. Keep out of the rooms that are being cleaned and painted. ?Wash your hands often with soap and water. If soap and water are not available, use hand sanitizer. ?Do not allow anyone to smoke in your home. ?General instructions ?Take over-the-counter and prescription medicines only as told by your doctor. ?Talk with your doctor if you have questions about how or when to take your medicines. ?Make note if you need to use your medicines more often than usual. ?Do not use any products that contain nicotine or tobacco, such as cigarettes and e-cigarettes. If you need help quitting, ask your doctor. ?Stay away from secondhand smoke. ?Avoid doing things outdoors when allergen counts are high and when air quality is low. ?Wear a ski mask when doing outdoor activities in the winter. The mask should cover your nose and mouth. Exercise indoors on cold days if you can. ?Warm up before you exercise. Take time to cool down after exercise. ?Use a peak flow meter as   told by your doctor. A peak flow meter is a tool that measures how well the lungs are working. ?Keep track of the peak flow meter's readings. Write them down. ?Follow your asthma action plan. This is a written plan for taking care  of your asthma and treating your attacks. ?Make sure you get all the shots (vaccines) that your doctor recommends. Ask your doctor about a flu shot and a pneumonia shot. ?Keep all follow-up visits as told by your doctor. This is important. ?Contact a doctor if: ?You have wheezing, shortness of breath, or a cough even while taking medicine to prevent attacks. ?The mucus you cough up (sputum) is thicker than usual. ?The mucus you cough up changes from clear or white to yellow, green, gray, or bloody. ?You have problems from the medicine you are taking, such as: ?A rash. ?Itching. ?Swelling. ?Trouble breathing. ?You need reliever medicines more than 2-3 times a week. ?Your peak flow reading is still at 50-79% of your personal best after following the action plan for 1 hour. ?You have a fever. ?Get help right away if: ?You seem to be worse and are not responding to medicine during an asthma attack. ?You are short of breath even at rest. ?You get short of breath when doing very little activity. ?You have trouble eating, drinking, or talking. ?You have chest pain or tightness. ?You have a fast heartbeat. ?Your lips or fingernails start to turn blue. ?You are light-headed or dizzy, or you faint. ?Your peak flow is less than 50% of your personal best. ?You feel too tired to breathe normally. ?Summary ?Asthma is a long-term (chronic) condition in which the airways get tight and narrow. An asthma attack can make it hard to breathe. ?Asthma cannot be cured, but medicines and lifestyle changes can help control it. ?Make sure you understand how to avoid triggers and how and when to use your medicines. ?This information is not intended to replace advice given to you by your health care provider. Make sure you discuss any questions you have with your health care provider. ?Document Revised: 05/31/2019 Document Reviewed: 06/10/2019 ?Elsevier Patient Education ? 2022 Elsevier Inc[employer] ? ?

## 2021-04-11 NOTE — Progress Notes (Signed)
Virtual Visit Consent   Ashley Wells, you are scheduled for a virtual visit with Mary-Margaret Daphine Deutscher, FNP, a [facility] provider, today.     Just as with appointments in the office, your consent must be obtained to participate.  Your consent will be active for this visit and any virtual visit you may have with one of our providers in the next 365 days.     If you have a MyChart account, a copy of this consent can be sent to you electronically.  All virtual visits are billed to your insurance company just like a traditional visit in the office.    As this is a virtual visit, video technology does not allow for your provider to perform a traditional examination.  This may limit your provider's ability to fully assess your condition.  If your provider identifies any concerns that need to be evaluated in person or the need to arrange testing (such as labs, EKG, etc.), we will make arrangements to do so.     Although advances in technology are sophisticated, we cannot ensure that it will always work on either your end or our end.  If the connection with a video visit is poor, the visit may have to be switched to a telephone visit.  With either a video or telephone visit, we are not always able to ensure that we have a secure connection.     I need to obtain your verbal consent now.   Are you willing to proceed with your visit today? YES   Tamla Winkels Wiltrout has provided verbal consent on 04/11/2021 for a virtual visit (video or telephone).   Mary-Margaret Daphine Deutscher, FNP   Date: 04/11/2021 6:07 PM   Virtual Visit via Video Note   I, Mary-Margaret Daphine Deutscher, connected with Norena Bratton Nedrow ([id number], 06-05-1990) on 04/11/21 at  6:15 PM EST by a video-enabled telemedicine application and verified that I am speaking with the correct person using two identifiers.  Location: Patient: Virtual Visit Location Patient: Home Provider: Virtual Visit Location Provider: Mobile   I discussed the  limitations of evaluation and management by telemedicine and the availability of in person appointments. The patient expressed understanding and agreed to proceed.    History of Present Illness: Ashley Wells is a 31 y.o. who identifies as a female who was assigned female at birth, and is being seen today for asthma.  HPI: Patient had a video visit 2 weeks ago with right hand swollen. She was prescribed mobic. She took it one time a day  and got btter. Now it is swollen again. Pain is between her index and middle finger .   * she had albuterol refilled and is in a different colored bottle and doe snot seem  to work as well   Review of Systems  Constitutional:  Negative for diaphoresis and weight loss.  Eyes:  Negative for blurred vision, double vision and pain.  Respiratory:  Negative for shortness of breath.   Cardiovascular:  Negative for chest pain, palpitations, orthopnea and leg swelling.  Gastrointestinal:  Negative for abdominal pain.  Musculoskeletal:  Positive for joint pain (right hand).  Skin:  Negative for rash.  Neurological:  Negative for dizziness, sensory change, loss of consciousness, weakness and headaches.  Endo/Heme/Allergies:  Negative for polydipsia. Does not bruise/bleed easily.  Psychiatric/Behavioral:  Negative for memory loss. The patient does not have insomnia.   All other systems reviewed and are negative.  Problems: There are no problems to display  for this patient.   Allergies:  Allergies  Allergen Reactions   Shellfish Allergy Anaphylaxis and Hives   Medications:  Current Outpatient Medications:    albuterol (PROVENTIL HFA) 108 (90 Base) MCG/ACT inhaler, Inhale 1-2 puffs into the lungs every 6 (six) hours as needed for wheezing or shortness of breath., Disp: 18 g, Rfl: 0   Melatonin 12 MG TABS, Take 12 mg by mouth daily as needed (sleep)., Disp: , Rfl:    meloxicam (MOBIC) 15 MG tablet, Take 1 tablet (15 mg total) by mouth daily., Disp: 15 tablet,  Rfl: 0  Observations/Objective: Patient is well-developed, well-nourished in no acute distress.  Resting comfortably  at home.  Head is normocephalic, atraumatic.  No labored breathing.  Speech is clear and coherent with logical content.  Patient is alert and oriented at baseline.  Edema noted between first and second distal metacarpat of right hand Pain on flexion and extension of index and middle finger.  Assessment and Plan:  Ashley Charleston Schweickert in today with chief complaint of Asthma   1. Pain of right hand Warm epsom alt soaks  2. Mild intermittent asthma with acute exacerbation Caontinue albutrol 2 puffs every 2 hours. Some brand do not work as well, but probably nothing can do about it at this time  Meds ordered this encounter  Medications   predniSONE (DELTASONE) 20 MG tablet    Sig: Take 2 tablets (40 mg total) by mouth daily with breakfast for 5 days. 2 po daily for 5 days    Dispense:  10 tablet    Refill:  0    Order Specific Question:   Supervising Provider    Answer:   Eber Hong [[id number]]     Follow Up Instructions: I discussed the assessment and treatment plan with the patient. The patient was provided an opportunity to ask questions and all were answered. The patient agreed with the plan and demonstrated an understanding of the instructions.  A copy of instructions were sent to the patient via MyChart.  The patient was advised to call back or seek an in-person evaluation if the symptoms worsen or if the condition fails to improve as anticipated.  Time:  I spent 14 minutes with the patient via telehealth technology discussing the above problems/concerns.    Mary-Margaret Daphine Deutscher, FNP

## 2021-04-13 IMAGING — CT CT RENAL STONE PROTOCOL
2 of 4 series · 17 of 46 positions shown, 19 images · non-contrast
Comparison: None.

CLINICAL DATA: Right flank pain

EXAM:
CT ABDOMEN AND PELVIS WITHOUT CONTRAST
TECHNIQUE: Multidetector CT imaging of the abdomen and pelvis was performed
following the standard protocol without IV contrast.

[Series 2: axial st · axial · 0.66mm/px · z∈[+1126,+1476]mm · 14 of 81 slices shown, 16 images]
[im 6/81  soft-tissue]
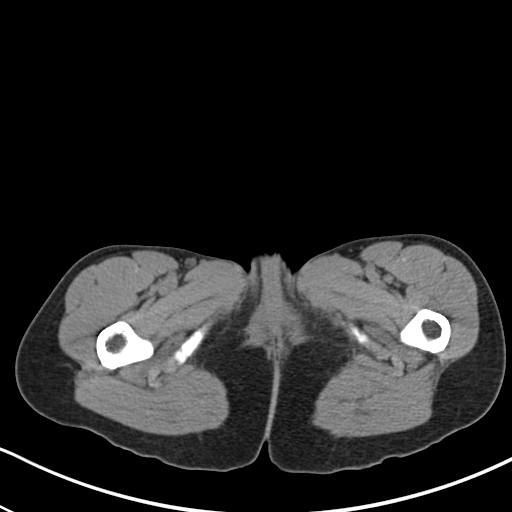
[im 6/81  bone]
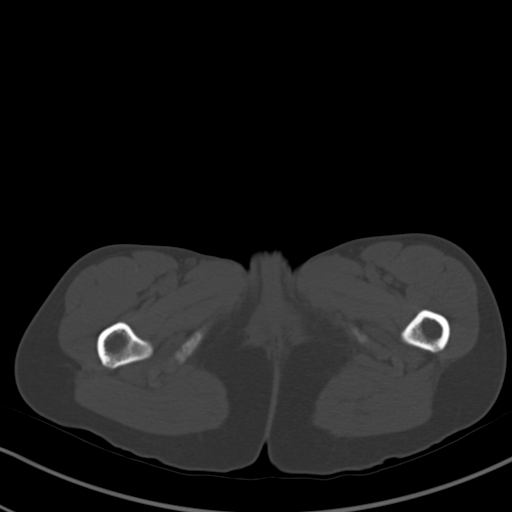
[im 11/81  soft-tissue]
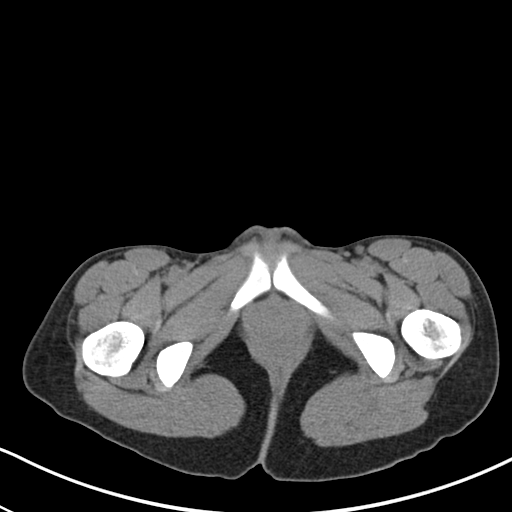
[im 16/81  soft-tissue]
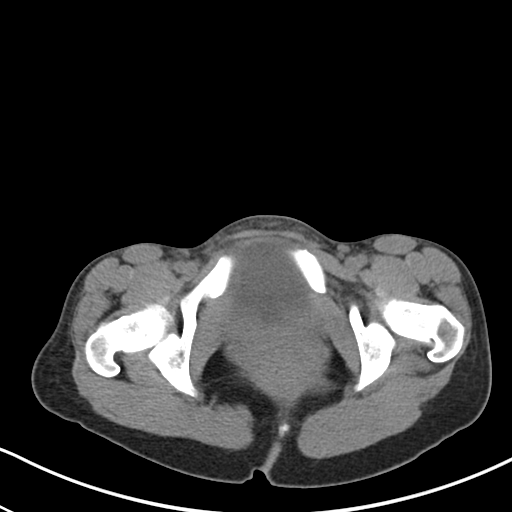
[im 21/81  soft-tissue]
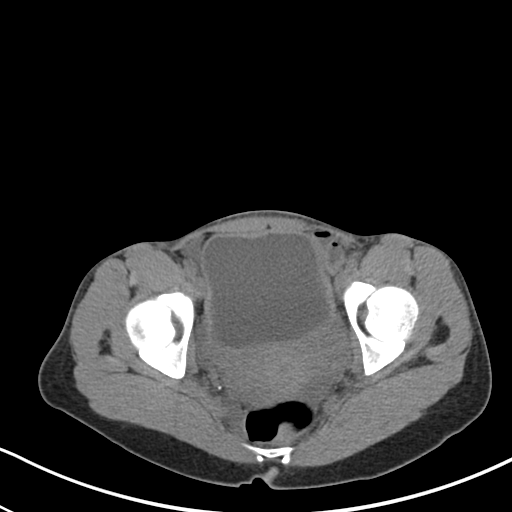
[im 26/81  soft-tissue]
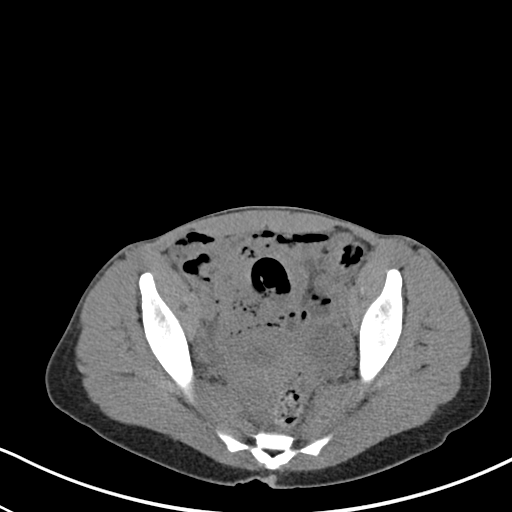
[im 31/81  soft-tissue]
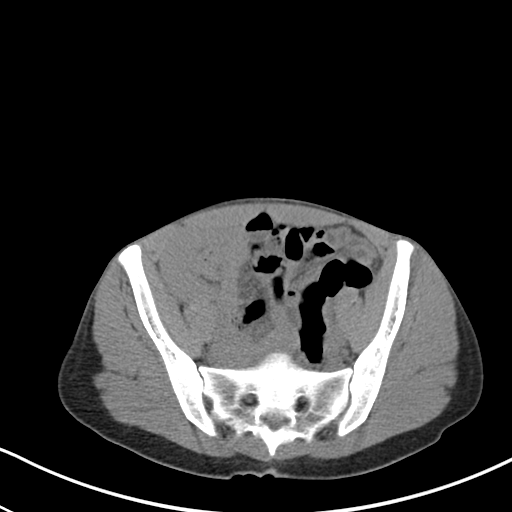
[im 36/81  soft-tissue]
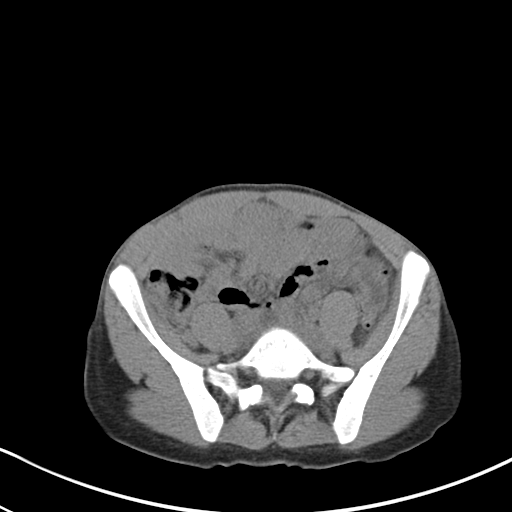
[im 46/81  soft-tissue]
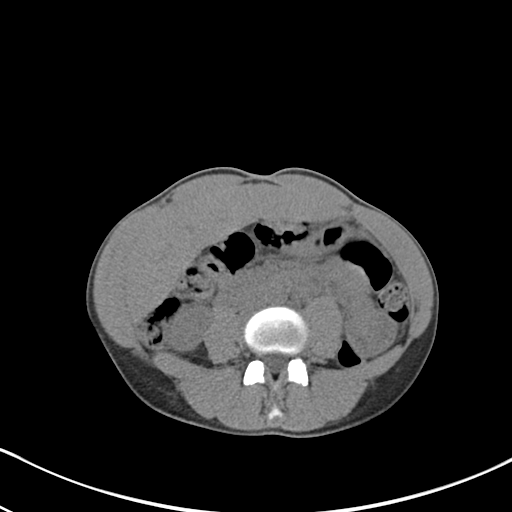
[im 51/81  soft-tissue]
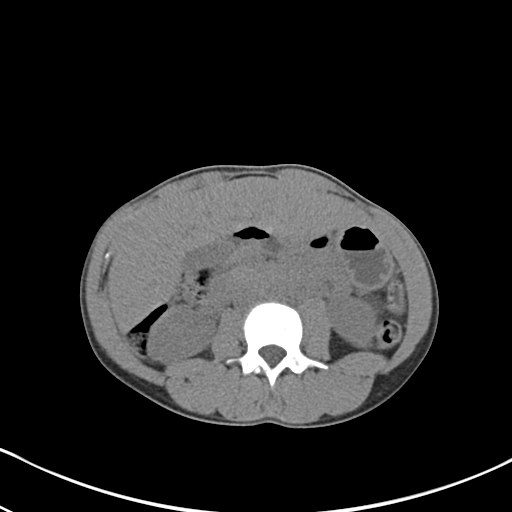
[im 51/81  bone]
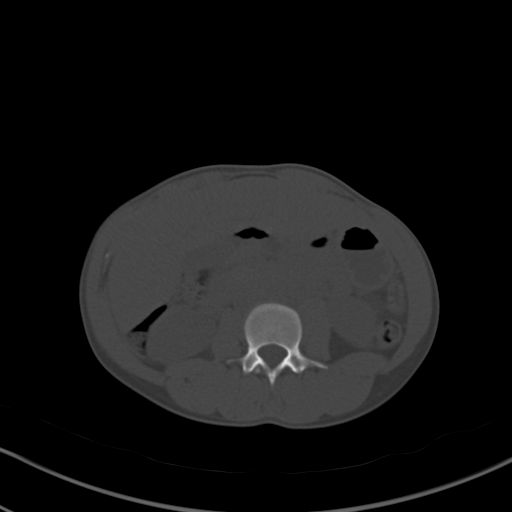
[im 56/81  soft-tissue]
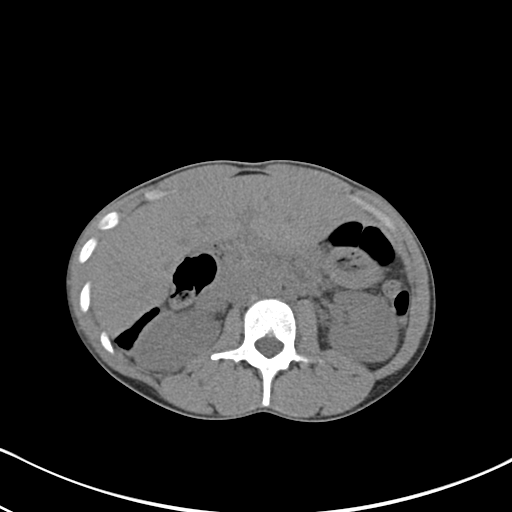
[im 61/81  soft-tissue]
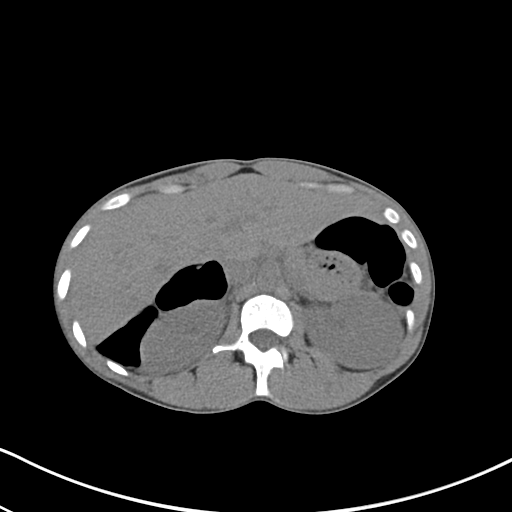
[im 66/81  soft-tissue]
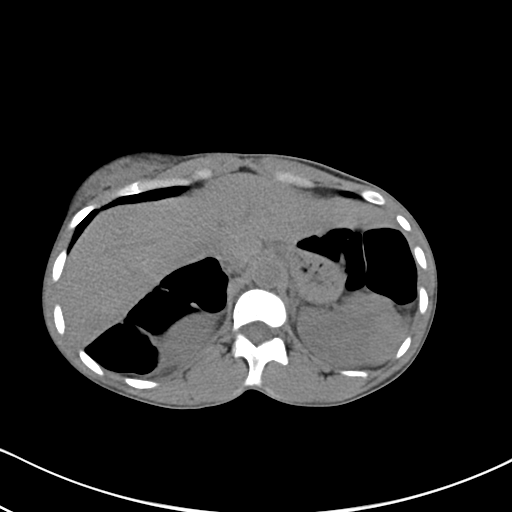
[im 71/81  soft-tissue]
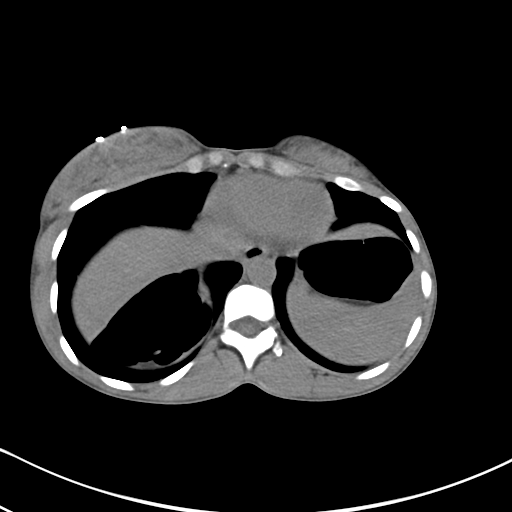
[im 76/81  soft-tissue]
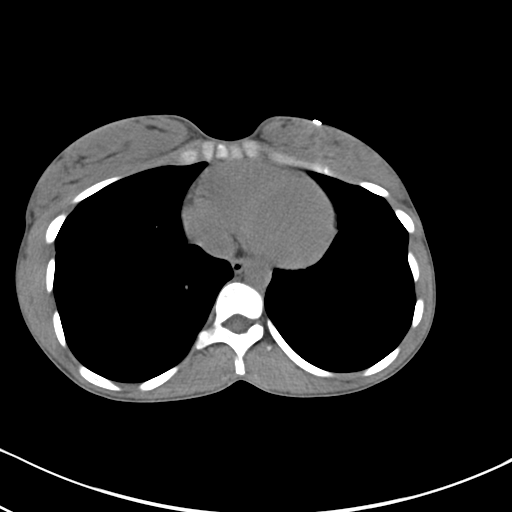

[Series 5: coronal · coronal · 0.67mm/px · 3 of 124 slices shown]
[im 42/124  soft-tissue]
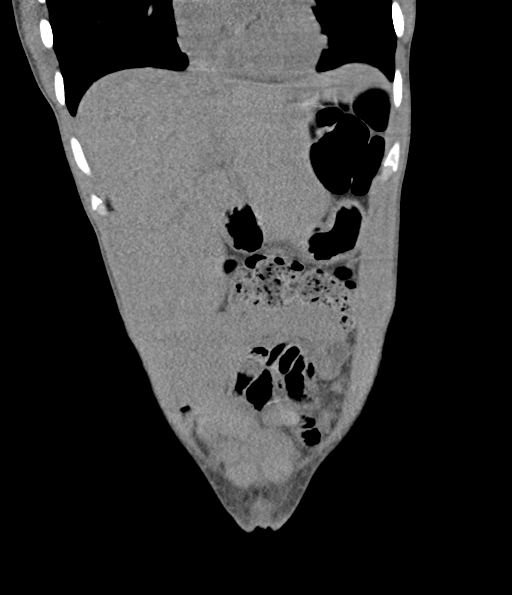
[im 55/124  soft-tissue]
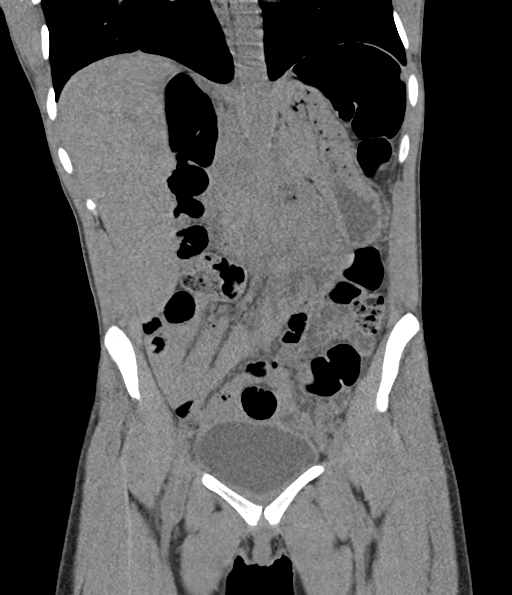
[im 69/124  soft-tissue]
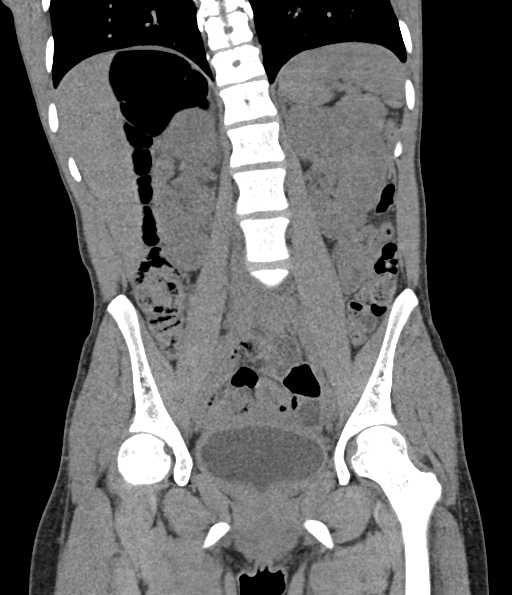

[17 of 46 positions shown; findings below may reference images not displayed]

FINDINGS: LOWER CHEST: Normal.

HEPATOBILIARY: Normal hepatic contours. No intra- or extrahepatic
biliary dilatation. The gallbladder is normal.

PANCREAS: Normal pancreas. No ductal dilatation or peripancreatic
fluid collection.

SPLEEN: Normal.

ADRENALS/URINARY TRACT: The adrenal glands are normal. No
hydronephrosis, nephroureterolithiasis or solid renal mass. The
urinary bladder is normal for degree of distention

STOMACH/BOWEL: There is no hiatal hernia. Normal duodenal course and
caliber. No small bowel dilatation or inflammation. Large amount of
gas within the hepatic and splenic flexures. Normal appendix.

VASCULAR/LYMPHATIC: Normal course and caliber of the major abdominal
vessels. No abdominal or pelvic lymphadenopathy.

REPRODUCTIVE: Normal uterus. No adnexal mass.

MUSCULOSKELETAL. No bony spinal canal stenosis or focal osseous
abnormality.

OTHER: None.
IMPRESSION: 1. No obstructive uropathy or other acute abnormality of the abdomen
or pelvis.
2. Large amount of gas within the hepatic and splenic flexures.

## 2021-04-15 ENCOUNTER — Telehealth: Payer: Self-pay | Admitting: Emergency Medicine

## 2021-04-15 ENCOUNTER — Telehealth: Payer: Self-pay

## 2021-04-15 DIAGNOSIS — R112 Nausea with vomiting, unspecified: Secondary | ICD-10-CM

## 2021-04-15 DIAGNOSIS — R6889 Other general symptoms and signs: Secondary | ICD-10-CM

## 2021-04-16 ENCOUNTER — Encounter: Payer: Self-pay | Admitting: Emergency Medicine

## 2021-04-16 DIAGNOSIS — J45909 Unspecified asthma, uncomplicated: Secondary | ICD-10-CM | POA: Insufficient documentation

## 2021-04-16 DIAGNOSIS — N920 Excessive and frequent menstruation with regular cycle: Secondary | ICD-10-CM | POA: Insufficient documentation

## 2021-04-16 MED ORDER — ONDANSETRON HCL 4 MG PO TABS: 4.0000 mg | ORAL_TABLET | Freq: Three times a day (TID) | ORAL | 0 refills | Status: DC | PRN

## 2021-04-16 MED ORDER — OSELTAMIVIR PHOSPHATE 75 MG PO CAPS: 75.0000 mg | ORAL_CAPSULE | Freq: Two times a day (BID) | ORAL | 0 refills | Status: AC

## 2021-04-16 NOTE — Progress Notes (Signed)
I have spent 5 minutes in review of e-visit questionnaire, review and updating patient chart, medical decision making and response to patient.   Elaya Droege, PA-C    

## 2021-04-16 NOTE — Progress Notes (Signed)
E visit for Flu like symptoms   We are sorry that you are not feeling well.  Here is how we plan to help! Based on what you have shared with me it looks like you may have a respiratory virus that may be influenza.  Influenza or the flu is   an infection caused by a respiratory virus. The flu virus is highly contagious and persons who did not receive their yearly flu vaccination may catch the flu from close contact.  We have anti-viral medications to treat the viruses that cause this infection. They are not a cure and only shorten the course of the infection. These prescriptions are most effective when they are given within the first 2 days of flu symptoms. Antiviral medication are indicated if you have a high risk of complications from the flu. You should  also consider an antiviral medication if you are in close contact with someone who is at risk. These medications can help patients avoid complications from the flu  but have side effects that you should know. Possible side effects from Tamiflu or oseltamivir include nausea, vomiting, diarrhea, dizziness, headaches, eye redness, sleep problems or other respiratory symptoms. You should not take Tamiflu if you have an allergy to oseltamivir or any to the ingredients in Tamiflu.  Based upon your symptoms and potential risk factors I have prescribed Oseltamivir (Tamiflu).  It has been sent to your designated pharmacy.  You will take one 75 mg capsule orally twice a day for the next 5 days.  I will also prescribed zofran for nausea and vomiting.  Please seek in person care if symptoms progress, do not improve with medications, or worsen.    ANYONE WHO HAS FLU SYMPTOMS SHOULD: Stay home. The flu is highly contagious and going out or to work exposes others! Be sure to drink plenty of fluids. Water is fine as well as fruit juices, sodas and electrolyte beverages. You may want to stay away from caffeine or alcohol. If you are nauseated, try taking  small sips of liquids. How do you know if you are getting enough fluid? Your urine should be a pale yellow or almost colorless. Get rest. Taking a steamy shower or using a humidifier may help nasal congestion and ease sore throat pain. Using a saline nasal spray works much the same way. Cough drops, hard candies and sore throat lozenges may ease your cough. Line up a caregiver. Have someone check on you regularly.   GET HELP RIGHT AWAY IF: You cannot keep down liquids or your medications. You become short of breath Your fell like you are going to pass out or loose consciousness. Your symptoms persist after you have completed your treatment plan MAKE SURE YOU  Understand these instructions. Will watch your condition. Will get help right away if you are not doing well or get worse.  Your e-visit answers were reviewed by a board certified advanced clinical practitioner to complete your personal care plan.  Depending on the condition, your plan could have included both over the counter or prescription medications.  If there is a problem please reply  once you have received a response from your provider.  Your safety is important to [place].  If you have drug allergies check your prescription carefully.    You can use MyChart to ask questions about todays visit, request a non-urgent call back, or ask for a work or school excuse for 24 hours related to this e-Visit. If it has been greater than 24  hours you will need to follow up with your provider, or enter a new e-Visit to address those concerns.  You will get an e-mail in the next two days asking about your experience.  I hope that your e-visit has been valuable and will speed your recovery. Thank you for using e-visits.

## 2021-04-23 ENCOUNTER — Telehealth: Payer: Self-pay | Admitting: Emergency Medicine

## 2021-04-23 DIAGNOSIS — S01511A Laceration without foreign body of lip, initial encounter: Secondary | ICD-10-CM

## 2021-04-23 NOTE — Progress Notes (Signed)
?Virtual Visit Consent  ? ?Ashley Wells, you are scheduled for a virtual visit with a [facility] provider today.   ?  ?Just as with appointments in the office, your consent must be obtained to participate.  Your consent will be active for this visit and any virtual visit you may have with one of our providers in the next 365 days.   ?  ?If you have a MyChart account, a copy of this consent can be sent to you electronically.  All virtual visits are billed to your insurance company just like a traditional visit in the office.   ? ?As this is a virtual visit, video technology does not allow for your provider to perform a traditional examination.  This may limit your provider's ability to fully assess your condition.  If your provider identifies any concerns that need to be evaluated in person or the need to arrange testing (such as labs, EKG, etc.), we will make arrangements to do so.   ?  ?Although advances in technology are sophisticated, we cannot ensure that it will always work on either your end or our end.  If the connection with a video visit is poor, the visit may have to be switched to a telephone visit.  With either a video or telephone visit, we are not always able to ensure that we have a secure connection.    ? ?I need to obtain your verbal consent now.   Are you willing to proceed with your visit today?  ?  ?Ashley Wells has provided verbal consent on 04/23/2021 for a virtual visit (video or telephone). ?  ?Carvel Getting, NP  ? ?Date: 04/23/2021 10:53 AM ? ? ?Virtual Visit via Video Note  ? ?Ashley Wells, connected with  Ashley Wells  ([id number], 1991/02/11) on 04/23/21 at 10:45 AM EST by a video-enabled telemedicine application and verified that I am speaking with the correct person using two identifiers. ? ?Location: ?Patient: Virtual Visit Location Patient: Other: In a car in [place]; she is not driving ?Provider: Virtual Visit Location Provider: Home Office ?  ?I discussed the  limitations of evaluation and management by telemedicine and the availability of in person appointments. The patient expressed understanding and agreed to proceed.   ? ?History of Present Illness: ?Ashley Wells is a 31 y.o. who identifies as a female who was assigned female at birth, and is being seen today for lip laceration.  Patient is visiting [place] from out of state.  She was skateboarding this morning and fell and split her left upper lip open with a laceration.  She tried going to non-[facility] urgent care facility and was told they would not take her insurance.  She is looking for advice on whether or not she needs stitches and where she can go for help.  She denies other injury. ? ?HPI: HPI  ?Problems:  ?Patient Active Problem List  ? Diagnosis Date Noted  ? Asthma 04/16/2021  ? Menorrhagia 04/16/2021  ?  ?Allergies:  ?Allergies  ?Allergen Reactions  ? Shellfish Allergy Anaphylaxis and Hives  ? ?Medications:  ?Current Outpatient Medications:  ?  albuterol (PROVENTIL HFA) 108 (90 Base) MCG/ACT inhaler, Inhale 1-2 puffs into the lungs every 6 (six) hours as needed for wheezing or shortness of breath., Disp: 18 g, Rfl: 0 ?  Melatonin 12 MG TABS, Take 12 mg by mouth daily as needed (sleep)., Disp: , Rfl:  ?  meloxicam (MOBIC) 15 MG tablet,  Take 1 tablet (15 mg total) by mouth daily., Disp: 15 tablet, Rfl: 0 ?  ondansetron (ZOFRAN) 4 MG tablet, Take 1 tablet (4 mg total) by mouth every 8 (eight) hours as needed for nausea or vomiting., Disp: 20 tablet, Rfl: 0 ? ?Observations/Objective: ?Patient is well-developed, well-nourished in no acute distress.  ?Resting comfortably  at in a car in [place].  Patient is not driving ?Head is normocephalic.  She has a lip laceration in the left upper lip mostly inside her mouth.  It does not cross the vermilion border ?No labored breathing.  ?Speech is clear and coherent with logical content.  ?Patient is alert and oriented at baseline.  ? ? ?Assessment and  Plan: ?1. Lip laceration, initial encounter ? ?We discussed treatment options.  Wound will heal on its own without stitches but it will heal better with suturing.  She also needs to update her tetanus shot. ? ?Follow Up Instructions: ?I discussed the assessment and treatment plan with the patient. The patient was provided an opportunity to ask questions and all were answered. The patient agreed with the plan and demonstrated an understanding of the instructions.  A copy of instructions were sent to the patient via MyChart unless otherwise noted below.  ? ? ?The patient was advised to call back or seek an in-person evaluation if the symptoms worsen or if the condition fails to improve as anticipated. ? ?Time:  ?I spent 8 minutes with the patient via telehealth technology discussing the above problems/concerns.   ? ?Carvel Getting, NP ?

## 2021-04-23 NOTE — Patient Instructions (Signed)
?  Ashley Wells, thank you for joining [name], NP for today's virtual visit.  While this provider is not your primary care provider (PCP), if your PCP is located in our provider database this encounter information will be shared with them immediately following your visit. ? ?Consent: ?(Patient) Ashley Wells provided verbal consent for this virtual visit at the beginning of the encounter. ? ?Current Medications: ? ?Current Outpatient Medications:  ?  albuterol (PROVENTIL HFA) 108 (90 Base) MCG/ACT inhaler, Inhale 1-2 puffs into the lungs every 6 (six) hours as needed for wheezing or shortness of breath., Disp: 18 g, Rfl: 0 ?  Melatonin 12 MG TABS, Take 12 mg by mouth daily as needed (sleep)., Disp: , Rfl:  ?  meloxicam (MOBIC) 15 MG tablet, Take 1 tablet (15 mg total) by mouth daily., Disp: 15 tablet, Rfl: 0 ?  ondansetron (ZOFRAN) 4 MG tablet, Take 1 tablet (4 mg total) by mouth every 8 (eight) hours as needed for nausea or vomiting., Disp: 20 tablet, Rfl: 0  ? ?Medications ordered in this encounter:  ?No orders of the defined types were placed in this encounter. ?  ? ?*If you need refills on other medications prior to your next appointment, please contact your pharmacy* ? ?Follow-Up: ?Call back or seek an in-person evaluation if the symptoms worsen or if the condition fails to improve as anticipated. ? ?Other Instructions ?I suggested you try a [facility] facility.  You do need an updated tetanus shot at a minimum. ? ? ?If you have been instructed to have an in-person evaluation today at a local Urgent Care facility, please use the link below. It will take you to a list of all of our available [facility], including address, phone number and hours of operation. Please do not delay care.  ?[facility] ? ?If you or a family member do not have a primary care provider, use the link below to schedule a visit and establish care. When you choose a [facility] primary care  physician or advanced practice provider, you gain a long-term partner in health. ?Find a Primary Care Provider ? ?Learn more about [facility]'s in-office and virtual care options: ?[facility] - Get Care Now  ?

## 2021-05-11 ENCOUNTER — Ambulatory Visit: Payer: Self-pay | Admitting: Family

## 2021-05-19 ENCOUNTER — Ambulatory Visit: Payer: Self-pay | Admitting: Family

## 2021-10-10 ENCOUNTER — Other Ambulatory Visit: Payer: Self-pay | Admitting: Physician Assistant

## 2021-10-10 ENCOUNTER — Telehealth: Payer: Self-pay | Admitting: Physician Assistant

## 2021-10-10 DIAGNOSIS — J4530 Mild persistent asthma, uncomplicated: Secondary | ICD-10-CM

## 2021-10-10 DIAGNOSIS — J4521 Mild intermittent asthma with (acute) exacerbation: Secondary | ICD-10-CM

## 2021-10-10 MED ORDER — ALBUTEROL SULFATE HFA 108 (90 BASE) MCG/ACT IN AERS: 2.0000 | INHALATION_SPRAY | Freq: Four times a day (QID) | RESPIRATORY_TRACT | 0 refills | Status: DC | PRN

## 2021-10-10 MED ORDER — PREDNISONE 20 MG PO TABS: 40.0000 mg | ORAL_TABLET | Freq: Every day | ORAL | 0 refills | Status: DC

## 2021-10-10 NOTE — Progress Notes (Signed)
Visit for Asthma  Based on what you have shared with me, it looks like you may have a flare up of your asthma.  Asthma is a chronic (ongoing) lung disease which results in airway obstruction, inflammation and hyper-responsiveness.   Asthma symptoms vary from person to person, with common symptoms including nighttime awakening and decreased ability to participate in normal activities as a result of shortness of breath. It is often triggered by changes in weather, changes in the season, changes in air temperature, or inside (home, school, daycare or work) allergens such as animal dander, mold, mildew, woodstoves or cockroaches.   It can also be triggered by hormonal changes, extreme emotion, physical exertion or an upper respiratory tract illness.     It is important to identify the trigger, and then eliminate or avoid the trigger if possible.   If you have been prescribed medications to be taken on a regular basis, it is important to follow the asthma action plan and to follow guidelines to adjust medication in response to increasing symptoms of decreased peak expiratory flow rate  Treatment: I have prescribed: A refill of your Albuterol (Proventil HFA; Ventolin HFA) 108 (90 Base) MCG/ACT Inhaler 2 puffs into the lungs every six hours as needed for wheezing or shortness of breath and Prednisone 40mg  by mouth per day for 5 days  HOME CARE Only take medications as instructed by your medical team. Consider wearing a mask or scarf to improve breathing air temperature have been shown to decrease irritation and decrease exacerbations Get rest. Taking a steamy shower or using a humidifier may help nasal congestion sand ease sore throat pain. You can place a towel over your head and breathe in the steam from hot water coming from a faucet. Using a saline nasal spray works much the same way.   Cough drops, hare candies and sore throat lozenges may ease your cough.  Avoid close contacts especially the very you and the elderly Cover your mouth if you cough or sneeze Always remember to wash your hands.    GET HELP RIGHT AWAY IF: You develop worsening symptoms; breathlessness at rest, drowsy, confused or agitated, unable to speak in full sentences You have coughing fits You develop a severe headache or visual changes You develop shortness of breath, difficulty breathing or start having chest pain Your symptoms persist after you have completed your treatment plan If your symptoms do not improve within 10 days  MAKE SURE YOU Understand these instructions. Will watch your condition. Will get help right away if you are not doing well or get worse.   Your e-visit answers were reviewed by a board certified advanced clinical practitioner to complete your personal care plan, Depending upon the condition, your plan could have included both over the counter or prescription medications.   Please review your pharmacy choice. Your safety is important to . If you have drug allergies check your prescription carefully.  You can use MyChart to ask questions about today's visit, request a non-urgent  call back, or ask for a work or school excuse for 24 hours related to this e-Visit. If it has been greater than 24 hours you will need to follow up with your provider, or enter a new e-Visit to address those concerns.   You will get an e-mail in the next two days asking about your experience. I hope that your e-visit has been valuable and will speed your recovery. Thank you for using e-visits.

## 2021-10-10 NOTE — Progress Notes (Signed)
I have spent 5 minutes in review of e-visit questionnaire, review and updating patient chart, medical decision making and response to patient.   Darcy Cordner Cody Melquan Ernsberger, PA-C    

## 2021-10-24 ENCOUNTER — Telehealth: Payer: Self-pay | Admitting: Family Medicine

## 2021-10-24 DIAGNOSIS — J4521 Mild intermittent asthma with (acute) exacerbation: Secondary | ICD-10-CM

## 2021-10-24 DIAGNOSIS — R051 Acute cough: Secondary | ICD-10-CM

## 2021-10-24 NOTE — Progress Notes (Signed)
Ashley Wells   Recent appt on 8/22 for asthma, seems to have cough and possible COVID S&S needs to be seen in person to be checked for covid and have lungs listened to.  Message sent

## 2021-11-24 ENCOUNTER — Telehealth: Payer: Self-pay | Admitting: Physician Assistant

## 2021-11-24 DIAGNOSIS — J4530 Mild persistent asthma, uncomplicated: Secondary | ICD-10-CM

## 2021-11-24 DIAGNOSIS — J4521 Mild intermittent asthma with (acute) exacerbation: Secondary | ICD-10-CM

## 2021-11-24 MED ORDER — ALBUTEROL SULFATE HFA 108 (90 BASE) MCG/ACT IN AERS: 1.0000 | INHALATION_SPRAY | Freq: Four times a day (QID) | RESPIRATORY_TRACT | 0 refills | Status: DC | PRN

## 2021-11-24 NOTE — Progress Notes (Signed)
Visit for Asthma  Based on what you have shared with me, it looks like you may have a flare up of your asthma.  Asthma is a chronic (ongoing) lung disease which results in airway obstruction, inflammation and hyper-responsiveness.   Asthma symptoms vary from person to person, with common symptoms including nighttime awakening and decreased ability to participate in normal activities as a result of shortness of breath. It is often triggered by changes in weather, changes in the season, changes in air temperature, or inside (home, school, daycare or work) allergens such as animal dander, mold, mildew, woodstoves or cockroaches.   It can also be triggered by hormonal changes, extreme emotion, physical exertion or an upper respiratory tract illness.     It is important to identify the trigger, and then eliminate or avoid the trigger if possible.   If you have been prescribed medications to be taken on a regular basis, it is important to follow the asthma action plan and to follow guidelines to adjust medication in response to increasing symptoms of decreased peak expiratory flow rate  Treatment: I have prescribed: Albuterol (Proventil HFA; Ventolin HFA) 108 (90 Base) MCG/ACT Inhaler 2 puffs into the lungs every six hours as needed for wheezing or shortness of breath  HOME CARE Only take medications as instructed by your medical team. Consider wearing a mask or scarf to improve breathing air temperature have been shown to decrease irritation and decrease exacerbations Get rest. Taking a steamy shower or using a humidifier may help nasal congestion sand ease sore throat pain. You can place a towel over your head and breathe in the steam from hot water coming from a faucet. Using a saline nasal spray works much the same way.  Cough drops, hare candies and sore throat lozenges may ease your  cough.  Avoid close contacts especially the very you and the elderly Cover your mouth if you cough or sneeze Always remember to wash your hands.    GET HELP RIGHT AWAY IF: You develop worsening symptoms; breathlessness at rest, drowsy, confused or agitated, unable to speak in full sentences You have coughing fits You develop a severe headache or visual changes You develop shortness of breath, difficulty breathing or start having chest pain Your symptoms persist after you have completed your treatment plan If your symptoms do not improve within 10 days  MAKE SURE YOU Understand these instructions. Will watch your condition. Will get help right away if you are not doing well or get worse.   Your e-visit answers were reviewed by a board certified advanced clinical practitioner to complete your personal care plan, Depending upon the condition, your plan could have included both over the counter or prescription medications.   Please review your pharmacy choice. Your safety is important to us. If you have drug allergies check your prescription carefully.  You can use MyChart to ask questions about today's visit, request a non-urgent  call back, or ask for a work or school excuse for 24 hours related to this e-Visit. If it has been greater than 24 hours you will need to follow up with your provider, or enter a new e-Visit to address those concerns.   You will get an e-mail in the next two days asking about your experience. I hope that your e-visit has been valuable and will speed your recovery. Thank you for using e-visits.  I have spent 5 minutes in review of e-visit questionnaire, review and updating patient chart, medical decision making and response   to patient.   Hailey Miles M Sakai Wolford, PA-C  

## 2021-12-15 ENCOUNTER — Telehealth: Payer: Self-pay | Admitting: Physician Assistant

## 2021-12-15 DIAGNOSIS — Z20822 Contact with and (suspected) exposure to covid-19: Secondary | ICD-10-CM

## 2021-12-15 MED ORDER — BENZONATATE 100 MG PO CAPS: 100.0000 mg | ORAL_CAPSULE | Freq: Three times a day (TID) | ORAL | 0 refills | Status: AC | PRN

## 2021-12-15 MED ORDER — FLUTICASONE PROPIONATE 50 MCG/ACT NA SUSP: 2.0000 | Freq: Every day | NASAL | 0 refills | Status: DC

## 2021-12-15 NOTE — Progress Notes (Signed)
E-Visit for Corona Virus Screening  Your current symptoms could be consistent with the coronavirus.  Many health care providers can now test patients at their office but not all are.  [facility] has multiple testing sites. For information on our COVID testing locations and hours go to [email or web address]  We are enrolling you in our MyChart Home Monitoring for COVID19 . Daily you will receive a questionnaire within the MyChart website. Our COVID 19 response team will be monitoring your responses daily.  Testing Information: The COVID-19 Community Testing sites are testing BY APPOINTMENT ONLY.  You can schedule online at [email or web address]  If you do not have access to a smart phone or computer you may call [phone] for an appointment.   Additional testing sites in the Community:  For [facility] Testing sites in [place]  https://www.cvs.com/minuteclinic/covid-19-testing  For Pop-up testing sites in [place]  https://covid19.ncdhhs.gov/about-covid-19/testing/find-my-testing-place/pop-testing-sites  For [facility] https://www[place].gov[place]/human-services/health-department/coronavirus-covid-19-info/covid-19-testing  For [place] testing in [place] https://www.[place].gov/[place]/human-services/health-department/coronavirus-covid-19-info/covid-19-testing  For [facility] testing in [place]   https://lhi.care/covidtesting  For  more information about community testing call [phone]   Please quarantine yourself while awaiting your test results. Please stay home for a minimum of 10 days from the first day of illness with improving symptoms and you have had 24 hours of no fever (without the use of Tylenol (Acetaminophen) Motrin (Ibuprofen) or any fever reducing medication).  Also - Do not get tested prior to returning to work because once you have had a positive test the test can stay positive for  more than a month in some cases.   You should wear a mask or cloth face covering over your nose and mouth if you must be around other people or animals, including pets (even at home). Try to stay at least 6 feet away from other people. This will protect the people around you.  Please continue good preventive care measures, including:  frequent hand-washing, avoid touching your face, cover coughs/sneezes, stay out of crowds and keep a 6 foot distance from others.  COVID-19 is a respiratory illness with symptoms that are similar to the flu. Symptoms are typically mild to moderate, but there have been cases of severe illness and death due to the virus.   The following symptoms may appear 2-14 days after exposure: Fever Cough Shortness of breath or difficulty breathing Chills Repeated shaking with chills Muscle pain Headache Sore throat New loss of taste or smell Fatigue Congestion or runny nose Nausea or vomiting Diarrhea  Go to the nearest hospital ED for assessment if fever/cough/breathlessness are severe or illness seems like a threat to life.  It is vitally important that if you feel that you have an infection such as this virus or any other virus that you stay home and away from places where you may spread it to others.  You should avoid contact with people age 65 and older.   You can use medication such as prescription cough medication called Tessalon Perles 100 mg. You may take 1-2 capsules every 8 hours as needed for cough and prescription for Fluticasone nasal spray 2 sprays in each nostril one time per day  You may also take acetaminophen (Tylenol) as needed for fever.  Reduce your risk of any infection by using the same precautions used for avoiding the common cold or flu:  Wash your hands often with soap and warm water for at least 20 seconds.  If soap and water are not readily available, use an alcohol-based   hand sanitizer with at least 60% alcohol.  If coughing or sneezing,  cover your mouth and nose by coughing or sneezing into the elbow areas of your shirt or coat, into a tissue or into your sleeve (not your hands). Avoid shaking hands with others and consider head nods or verbal greetings only. Avoid touching your eyes, nose, or mouth with unwashed hands.  Avoid close contact with people who are sick. Avoid places or events with large numbers of people in one location, like concerts or sporting events. Carefully consider travel plans you have or are making. If you are planning any travel outside or inside the [place], visit the CDC's Travelers' Health webpage for the latest health notices. If you have some symptoms but not all symptoms, continue to monitor at home and seek medical attention if your symptoms worsen. If you are having a medical emergency, call 911.  HOME CARE Only take medications as instructed by your medical team. Drink plenty of fluids and get plenty of rest. A steam or ultrasonic humidifier can help if you have congestion.   GET HELP RIGHT AWAY IF YOU HAVE EMERGENCY WARNING SIGNS** FOR COVID-19. If you or someone is showing any of these signs seek emergency medical care immediately. Call 911 or proceed to your closest emergency facility if: You develop worsening high fever. Trouble breathing Bluish lips or face Persistent pain or pressure in the chest New confusion Inability to wake or stay awake You cough up blood. Your symptoms become more severe  **This list is not all possible symptoms. Contact your medical provider for any symptoms that are sever or concerning to you.  MAKE SURE YOU  Understand these instructions. Will watch your condition. Will get help right away if you are not doing well or get worse.  Your e-visit answers were reviewed by a board certified advanced clinical practitioner to complete your personal care plan.  Depending on the condition, your plan could have included both over the counter or prescription  medications.  If there is a problem please reply once you have received a response from your provider.  Your safety is important to [place].  If you have drug allergies check your prescription carefully.    You can use MyChart to ask questions about today's visit, request a non-urgent call back, or ask for a work or school excuse for 24 hours related to this e-Visit. If it has been greater than 24 hours you will need to follow up with your provider, or enter a new e-Visit to address those concerns. You will get an e-mail in the next two days asking about your experience.  I hope that your e-visit has been valuable and will speed your recovery. Thank you for using e-visits.  I have spent 5 minutes in review of e-visit questionnaire, review and updating patient chart, medical decision making and response to patient.   Mar Daring, PA-C

## 2021-12-26 ENCOUNTER — Telehealth: Payer: Self-pay | Admitting: Physician Assistant

## 2021-12-26 DIAGNOSIS — M549 Dorsalgia, unspecified: Secondary | ICD-10-CM

## 2021-12-26 DIAGNOSIS — R202 Paresthesia of skin: Secondary | ICD-10-CM

## 2021-12-26 NOTE — Progress Notes (Signed)
Because of worsening and progressive pain and areas of altered sensation after this injury, I feel your condition warrants further evaluation and I recommend that you be seen in a face to face visit.   NOTE: There will be NO CHARGE for this eVisit   If you are having a true medical emergency please call 911.      For an urgent face to face visit, [facility] has seven urgent care centers for your convenience:     [facility] [place] Get Driving Directions [phone] [place], [place] [address]    [facility]) Get Driving Directions [phone] [place], [place] [address]  [facility] [place] [facility]) Get Driving Directions [phone] [address] [place],  [place]  [address]  [facility] [address] [place] Get Driving Directions  [phone] [place],  [place] [address]   [facility] Get Driving Directions [phone] [address] [place], [place] [address]   [facility] Get Driving Directions  [phone] [address] [place], [place] [address]   [facility] [place] Get Driving Directions [phone] [address] [place], [place] [address]  Your MyChart E-visit questionnaire answers were reviewed by a board certified advanced clinical practitioner to complete your personal care plan based on your specific symptoms.  Thank you for using e-Visits.

## 2022-01-03 ENCOUNTER — Telehealth: Payer: Self-pay | Admitting: Urgent Care

## 2022-01-03 DIAGNOSIS — J4531 Mild persistent asthma with (acute) exacerbation: Secondary | ICD-10-CM

## 2022-01-03 DIAGNOSIS — J4521 Mild intermittent asthma with (acute) exacerbation: Secondary | ICD-10-CM

## 2022-01-03 MED ORDER — AIRDUO DIGIHALER 55-14 MCG/ACT IN AEPB: 1.0000 | INHALATION_SPRAY | Freq: Two times a day (BID) | RESPIRATORY_TRACT | 0 refills | Status: AC

## 2022-01-03 MED ORDER — ALBUTEROL SULFATE HFA 108 (90 BASE) MCG/ACT IN AERS: 1.0000 | INHALATION_SPRAY | Freq: Four times a day (QID) | RESPIRATORY_TRACT | 0 refills | Status: DC | PRN

## 2022-01-03 NOTE — Progress Notes (Signed)
Melaine - It looks like in the past 7 weeks, you have had 4 visits for cough or respiratory symptoms. This is very concerning. It may be that your asthma has become persistent, requiring more than just an as needed inhaler, which we can address today. However, if you continue to have intermittent coughing and shortness of breath, particularly with recent time spent in jail, additional workup including labs and a chest xray may be indicated.  Please see the information below regardin my recommendations for todays treatment, but if you have recurrence of your symptoms within the next 10-30 days, an in person (face to face) office visit or urgent care visit would be required.                                                                                                                  Visit for Asthma  Based on what you have shared with me, it looks like you may have a flare up of your asthma.  Asthma is a chronic (ongoing) lung disease which results in airway obstruction, inflammation and hyper-responsiveness.   Asthma symptoms vary from person to person, with common symptoms including nighttime awakening and decreased ability to participate in normal activities as a result of shortness of breath. It is often triggered by changes in weather, changes in the season, changes in air temperature, or inside (home, school, daycare or work) allergens such as animal dander, mold, mildew, woodstoves or cockroaches.   It can also be triggered by hormonal changes, extreme emotion, physical exertion or an upper respiratory tract illness.     It is important to identify the trigger, and then eliminate or avoid the trigger if possible.   If you have been prescribed medications to be taken on a regular basis, it is important to follow the asthma action plan and to follow guidelines to adjust medication in response to increasing symptoms of decreased peak expiratory flow rate  Treatment: I have prescribed: Albuterol  (Proventil HFA; Ventolin HFA) 108 (90 Base) MCG/ACT Inhaler 2 puffs into the lungs every six hours as needed for wheezing or shortness of breath I have also called in Air-duo 2mcg/14mcg. Inhale one puff every 12 hours. Rinse mouth out with water after each use.  HOME CARE Only take medications as instructed by your medical team. Consider wearing a mask or scarf to improve breathing air temperature have been shown to decrease irritation and decrease exacerbations Get rest. Taking a steamy shower or using a humidifier may help nasal congestion sand ease sore throat pain. You can place a towel over your head and breathe in the steam from hot water coming from a faucet. Using a saline nasal spray works much the same way.  Cough drops, hare candies and sore throat lozenges may ease your cough.  Avoid close contacts especially the very you and the elderly Cover your mouth if you cough or sneeze Always remember to wash your hands.    GET HELP RIGHT AWAY IF: You develop worsening symptoms; breathlessness at  rest, drowsy, confused or agitated, unable to speak in full sentences You have coughing fits You develop a severe headache or visual changes You develop shortness of breath, difficulty breathing or start having chest pain Your symptoms persist after you have completed your treatment plan If your symptoms do not improve within 10 days  MAKE SURE YOU Understand these instructions. Will watch your condition. Will get help right away if you are not doing well or get worse.   Your e-visit answers were reviewed by a board certified advanced clinical practitioner to complete your personal care plan, Depending upon the condition, your plan could have included both over the counter or prescription medications.   Please review your pharmacy choice. Your safety is important to [place]. If you have drug allergies check your prescription carefully.  You can use MyChart to ask questions about today's  visit, request a non-urgent  call back, or ask for a work or school excuse for 24 hours related to this e-Visit. If it has been greater than 24 hours you will need to follow up with your provider, or enter a new e-Visit to address those concerns.   You will get an e-mail in the next two days asking about your experience. I hope that your e-visit has been valuable and will speed your recovery. Thank you for using e-visits.   I have spent 5 minutes in review of e-visit questionnaire, review and updating patient chart, medical decision making and response to patient.   Wynnie Pacetti L Verdell Dykman, PA

## 2022-02-05 ENCOUNTER — Telehealth: Payer: Self-pay | Admitting: Physician Assistant

## 2022-02-05 ENCOUNTER — Encounter: Payer: Self-pay | Admitting: Family Medicine

## 2022-02-05 DIAGNOSIS — J111 Influenza due to unidentified influenza virus with other respiratory manifestations: Secondary | ICD-10-CM

## 2022-02-05 MED ORDER — OSELTAMIVIR PHOSPHATE 75 MG PO CAPS: 75.0000 mg | ORAL_CAPSULE | Freq: Two times a day (BID) | ORAL | 0 refills | Status: DC

## 2022-02-05 NOTE — Progress Notes (Signed)
E visit for Flu like symptoms   We are sorry that you are not feeling well.  Here is how we plan to help! Based on what you have shared with me it looks like you may have a respiratory virus that may be influenza.  Influenza or "the flu" is   an infection caused by a respiratory virus. The flu virus is highly contagious and persons who did not receive their yearly flu vaccination may "catch" the flu from close contact.  We have anti-viral medications to treat the viruses that cause this infection. They are not a "cure" and only shorten the course of the infection. These prescriptions are most effective when they are given within the first 2 days of "flu" symptoms. Antiviral medication are indicated if you have a high risk of complications from the flu. You should  also consider an antiviral medication if you are in close contact with someone who is at risk. These medications can help patients avoid complications from the flu  but have side effects that you should know. Possible side effects from Tamiflu or oseltamivir include nausea, vomiting, diarrhea, dizziness, headaches, eye redness, sleep problems or other respiratory symptoms. You should not take Tamiflu if you have an allergy to oseltamivir or any to the ingredients in Tamiflu.  Based upon your symptoms and potential risk factors I have prescribed Oseltamivir (Tamiflu).  It has been sent to your designated pharmacy.  You will take one 75 mg capsule orally twice a day for the next 5 days.  ANYONE WHO HAS FLU SYMPTOMS SHOULD: Stay home. The flu is highly contagious and going out or to work exposes others! Be sure to drink plenty of fluids. Water is fine as well as fruit juices, sodas and electrolyte beverages. You may want to stay away from caffeine or alcohol. If you are nauseated, try taking small sips of liquids. How do you know if you are getting enough fluid? Your urine should be a pale yellow or almost colorless. Get rest. Taking a steamy  shower or using a humidifier may help nasal congestion and ease sore throat pain. Using a saline nasal spray works much the same way. Cough drops, hard candies and sore throat lozenges may ease your cough. Line up a caregiver. Have someone check on you regularly.   GET HELP RIGHT AWAY IF: You cannot keep down liquids or your medications. You become short of breath Your fell like you are going to pass out or loose consciousness. Your symptoms persist after you have completed your treatment plan MAKE SURE YOU  Understand these instructions. Will watch your condition. Will get help right away if you are not doing well or get worse.  Your e-visit answers were reviewed by a board certified advanced clinical practitioner to complete your personal care plan.  Depending on the condition, your plan could have included both over the counter or prescription medications.  If there is a problem please reply  once you have received a response from your provider.  Your safety is important to us.  If you have drug allergies check your prescription carefully.    You can use MyChart to ask questions about today's visit, request a non-urgent call back, or ask for a work or school excuse for 24 hours related to this e-Visit. If it has been greater than 24 hours you will need to follow up with your provider, or enter a new e-Visit to address those concerns.  You will get an e-mail in the next   two days asking about your experience.  I hope that your e-visit has been valuable and will speed your recovery. Thank you for using e-visits.  I have spent 5 minutes in review of e-visit questionnaire, review and updating patient chart, medical decision making and response to patient.   Meryl Hubers M Amadi Frady, PA-C  

## 2022-02-15 IMAGING — US US PELVIS COMPLETE
1 series · 15 of 25 positions shown · non-contrast
Comparison: CT 03/29/2020

CLINICAL DATA: Right pelvic pain

EXAM:
TRANSABDOMINAL AND TRANSVAGINAL ULTRASOUND OF PELVIS
DOPPLER ULTRASOUND OF OVARIES
TECHNIQUE: Both transabdominal and transvaginal ultrasound examinations of the
pelvis were performed. Transabdominal technique was performed for
global imaging of the pelvis including uterus, ovaries, adnexal
regions, and pelvic cul-de-sac.
It was necessary to proceed with endovaginal exam following the
transabdominal exam to visualize the uterus, endometrium, ovaries
and adnexa. Color and duplex Doppler ultrasound was utilized to
evaluate blood flow to the ovaries.

[Series 1: us pelvis complete mc & wl · 89 acquisitions, 15 frames shown]
[im 1/89]
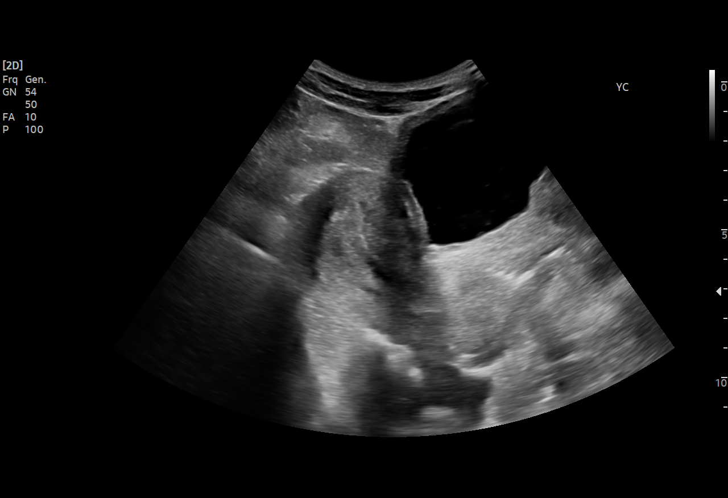
[im 8/89]
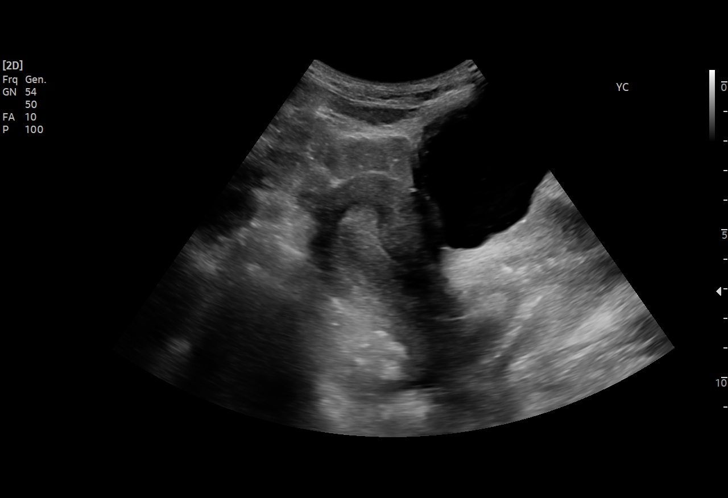
[im 15/89]
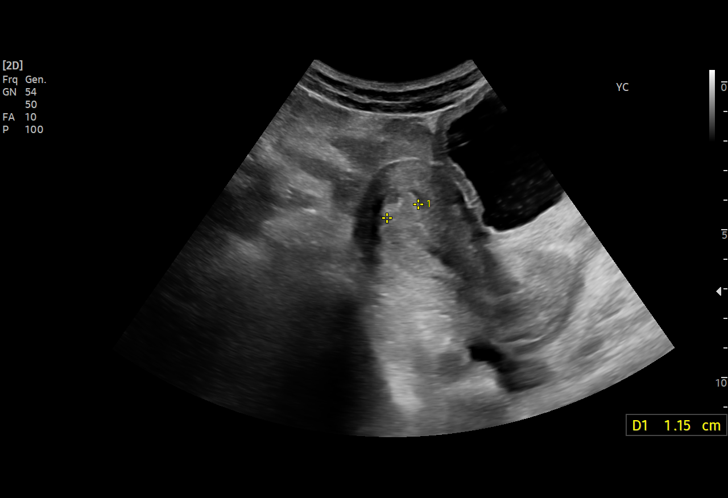
[im 19/89]
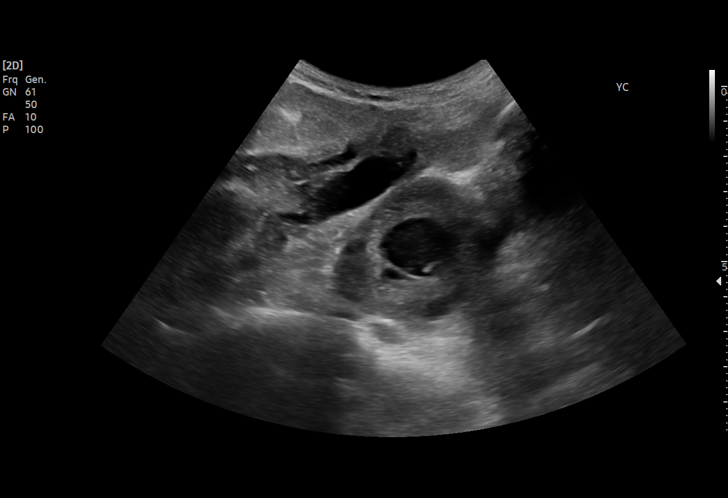
[im 26/89]
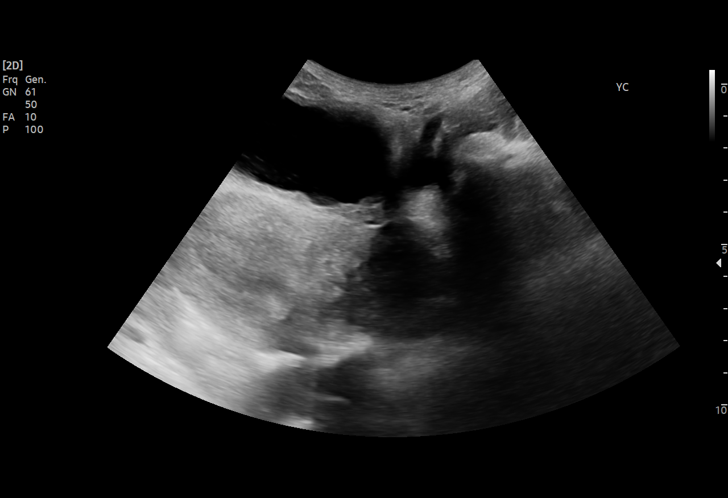
[im 34/89]
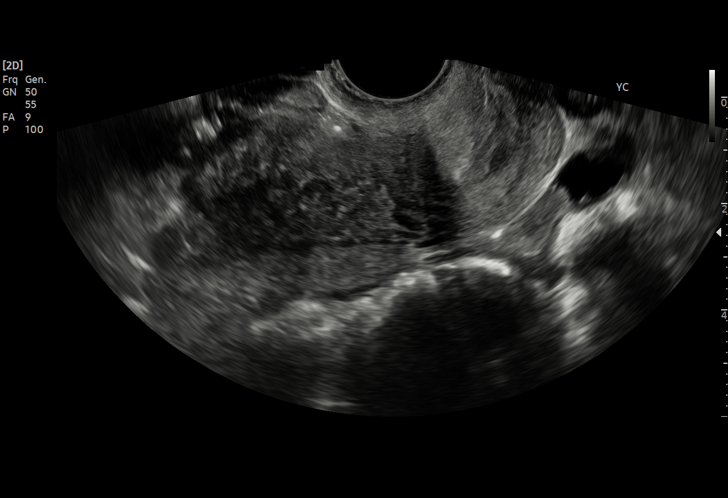
[im 37/89]
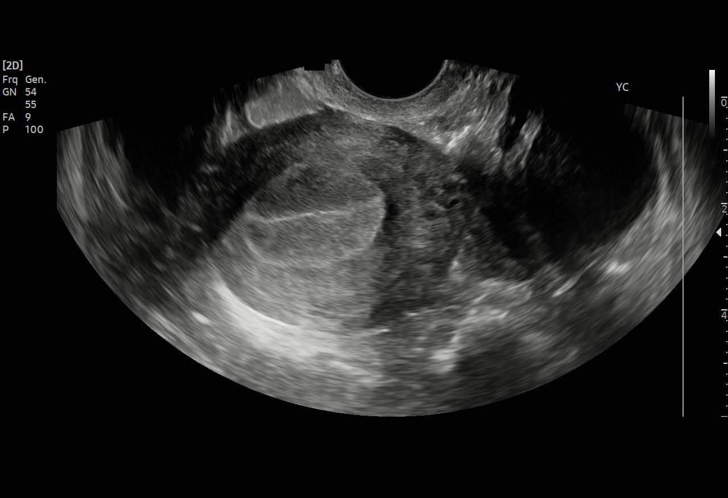
[im 45/89]
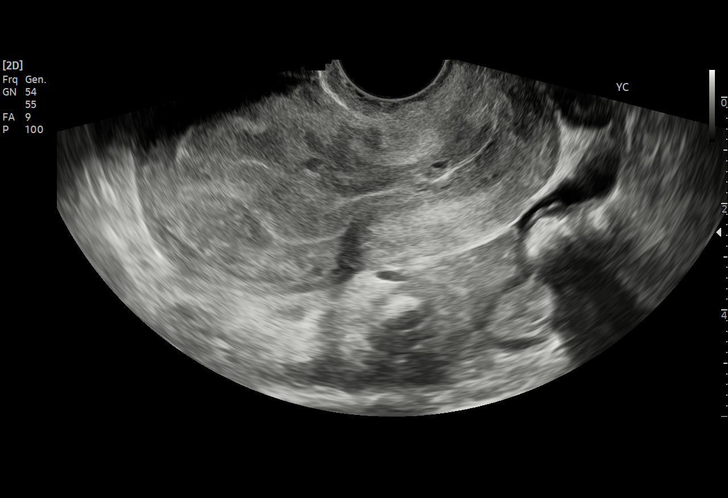
[im 52/89]
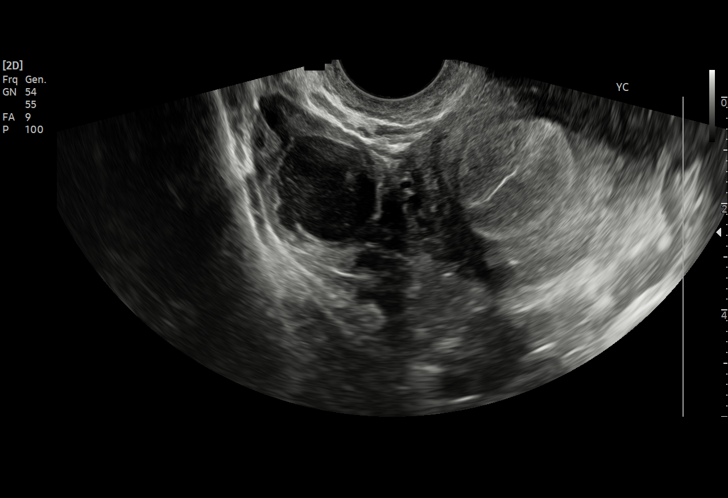
[im 56/89]
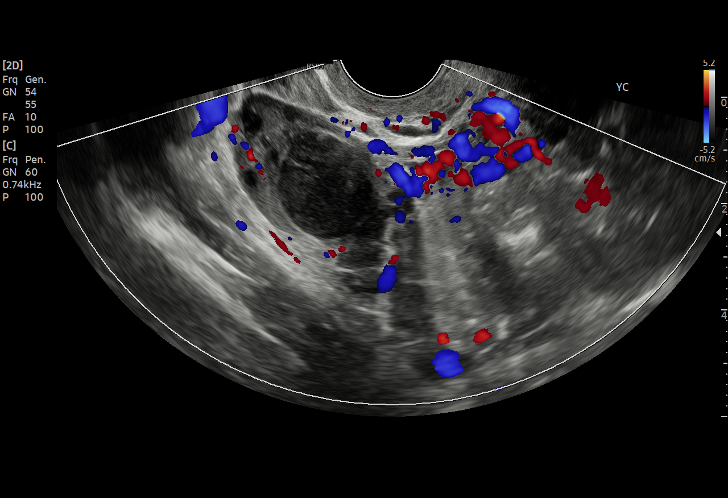
[im 63/89]
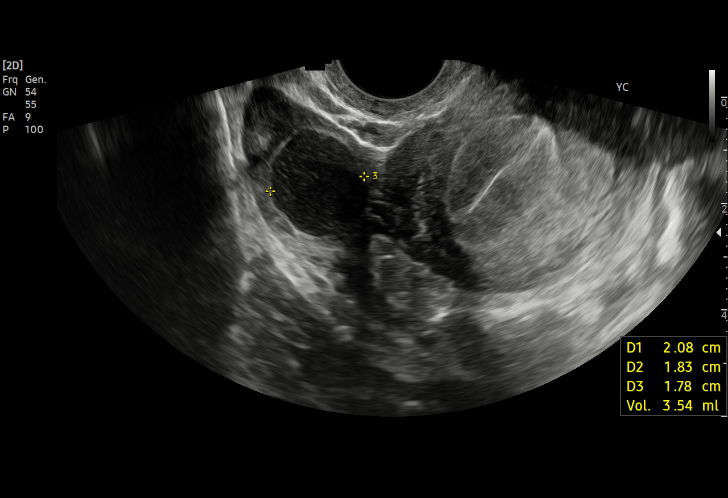
[im 70/89]
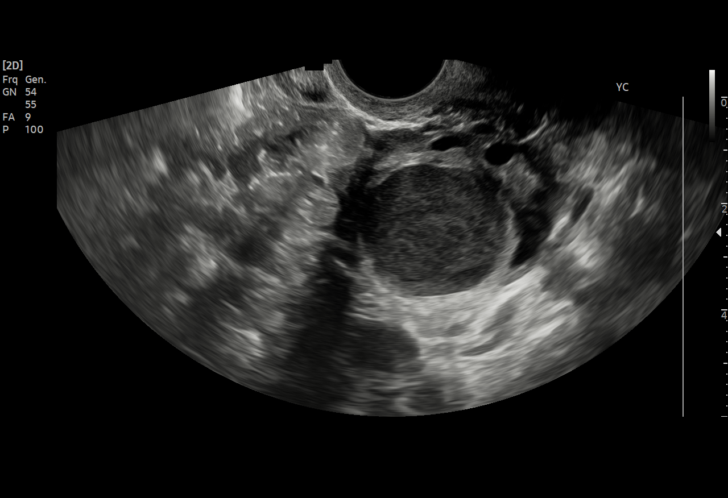
[im 74/89]
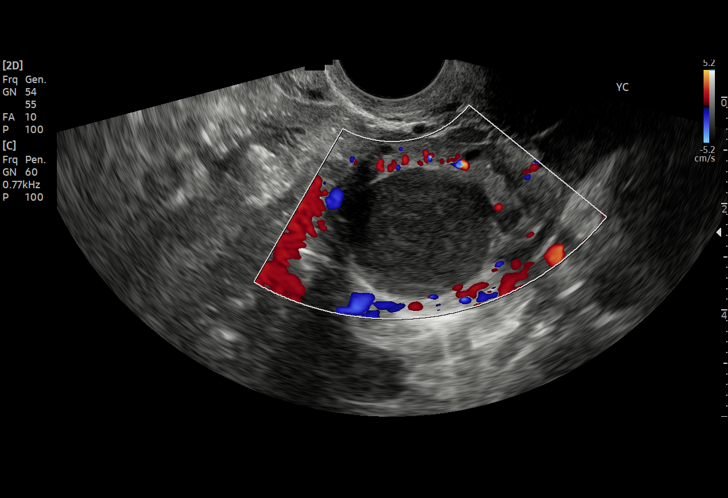
[im 81/89]
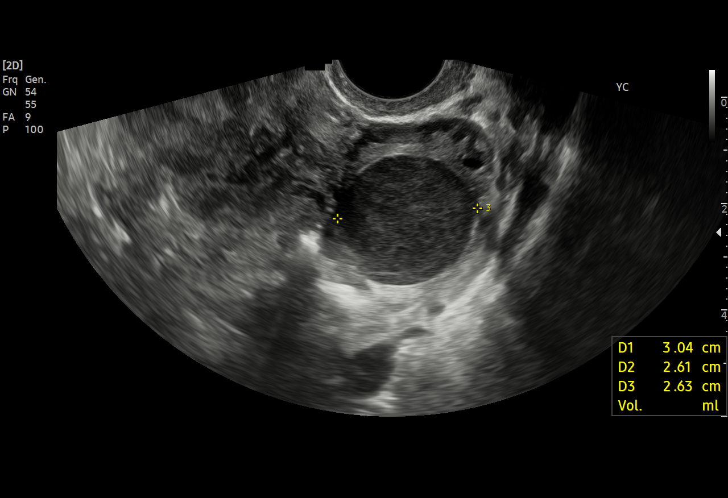
[im 89/89]
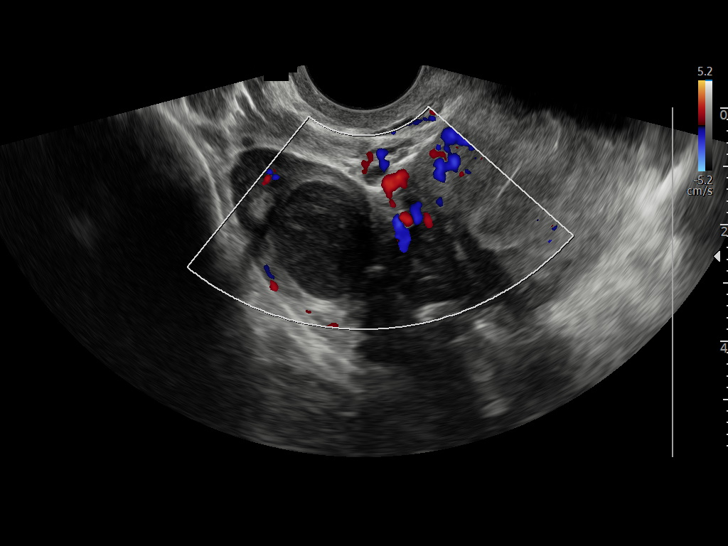

[15 of 25 positions shown; findings below may reference images not displayed]

FINDINGS: Uterus

Measurements: 7.8 x 4.0 x 5.2 cm = volume: 85 mL. No fibroids or
other mass visualized.

Endometrium

Thickness: 17 mm in thickness.  No focal abnormality visualized.

Right ovary

Measurements: 4.5 x 2.2 x 2.7 cm = volume: 13.7 mL. 2.1 cm small
cyst or follicle with internal echoes.

Left ovary

Measurements: 4.2 x 3.2 x 4.0 cm = volume: 27 mL. 3.0 cm hypoechoic
cyst with internal echoes compatible with hemorrhagic cyst.

Pulsed Doppler evaluation of both ovaries demonstrates normal
low-resistance arterial and venous waveforms.

Other findings

No abnormal free fluid.
IMPRESSION: Small bilateral hemorrhagic cysts or follicles. No evidence of
ovarian torsion.

## 2022-02-15 IMAGING — US US ART/VEN ABD/PELV/SCROTUM DOPPLER LTD
1 series · 15 of 25 positions shown · non-contrast
Comparison: CT 03/29/2020

CLINICAL DATA: Right pelvic pain

EXAM:
TRANSABDOMINAL AND TRANSVAGINAL ULTRASOUND OF PELVIS
DOPPLER ULTRASOUND OF OVARIES
TECHNIQUE: Both transabdominal and transvaginal ultrasound examinations of the
pelvis were performed. Transabdominal technique was performed for
global imaging of the pelvis including uterus, ovaries, adnexal
regions, and pelvic cul-de-sac.
It was necessary to proceed with endovaginal exam following the
transabdominal exam to visualize the uterus, endometrium, ovaries
and adnexa. Color and duplex Doppler ultrasound was utilized to
evaluate blood flow to the ovaries.

[Series 1: us pelvis complete mc & wl · 89 acquisitions, 15 frames shown]
[im 1/89]
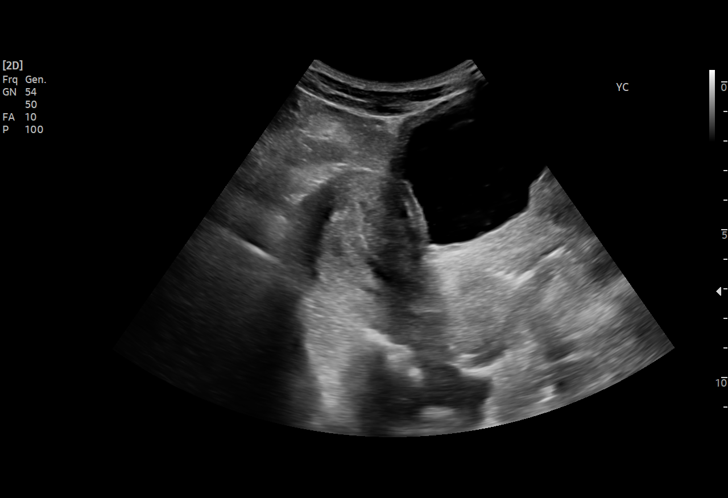
[im 8/89]
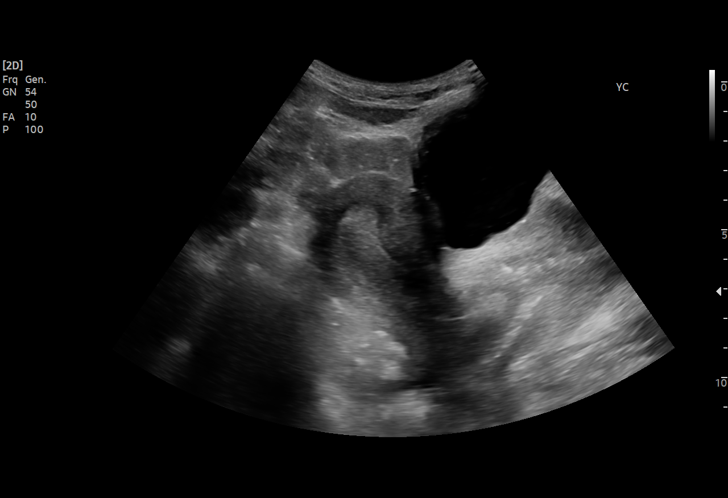
[im 15/89]
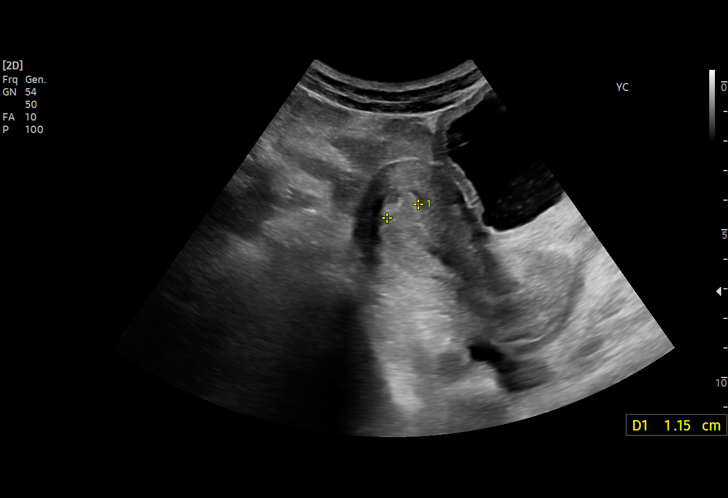
[im 19/89]
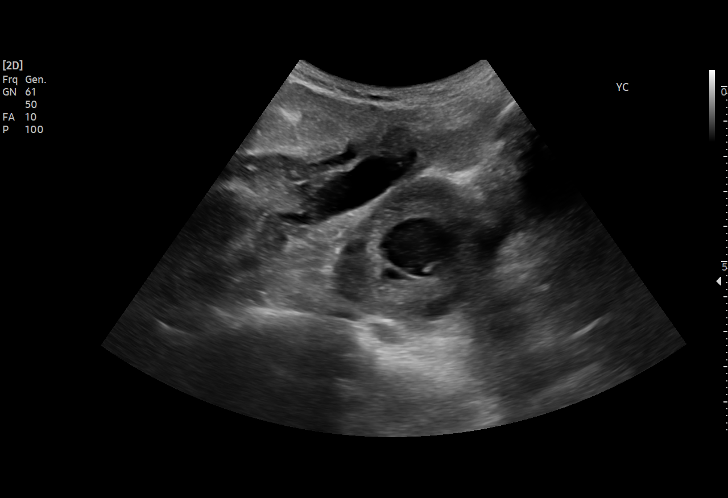
[im 26/89]
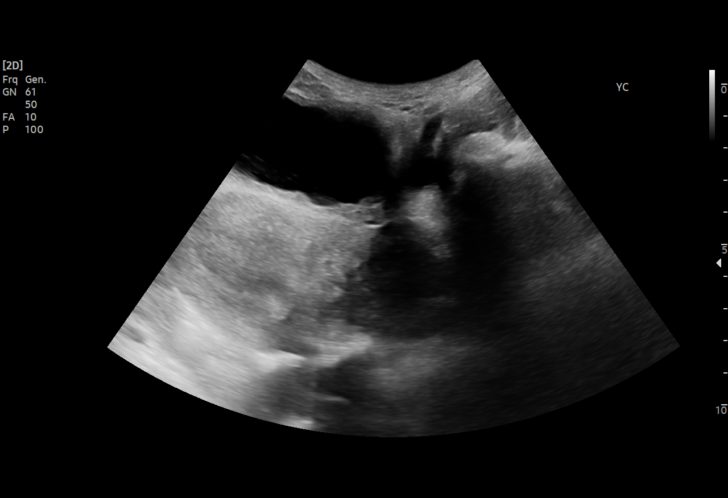
[im 34/89]
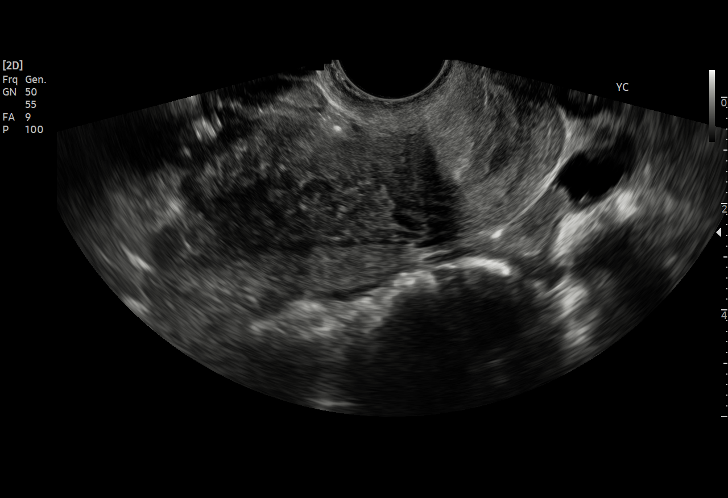
[im 37/89]
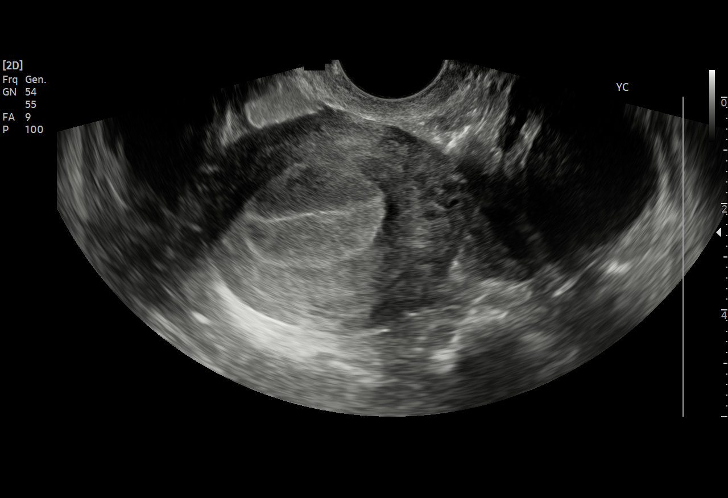
[im 45/89]
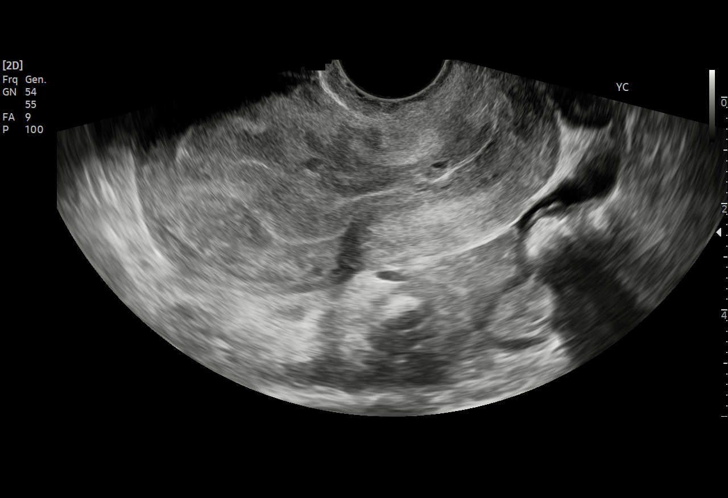
[im 52/89]
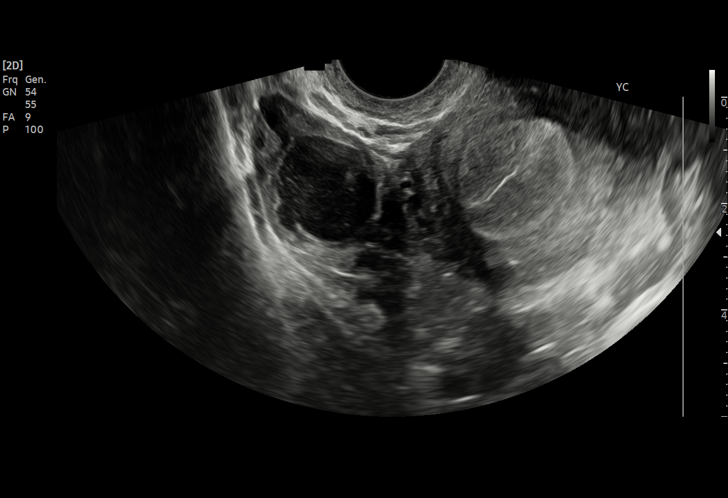
[im 56/89]
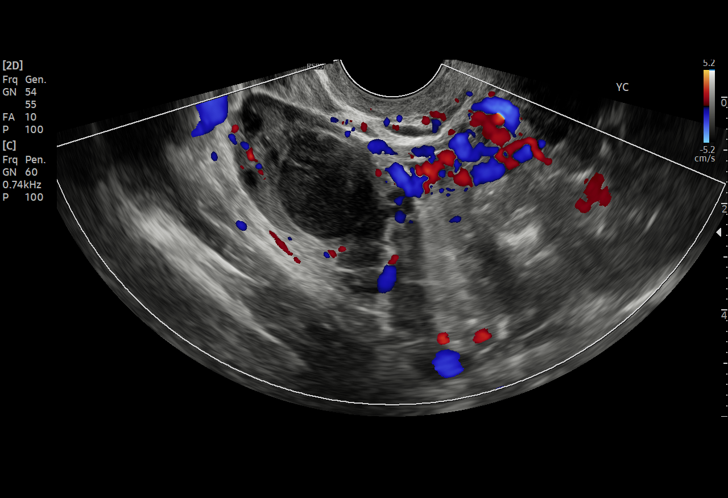
[im 63/89]
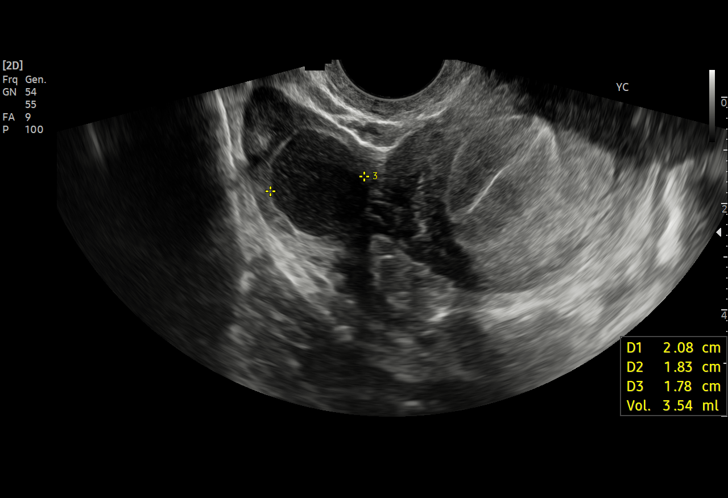
[im 70/89]
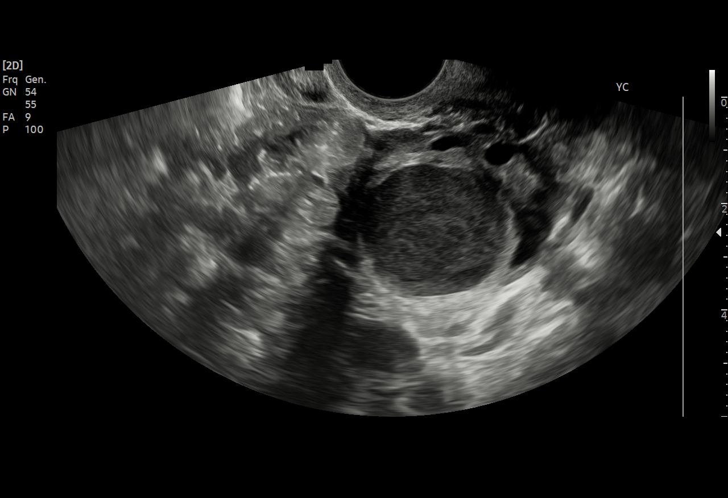
[im 74/89]
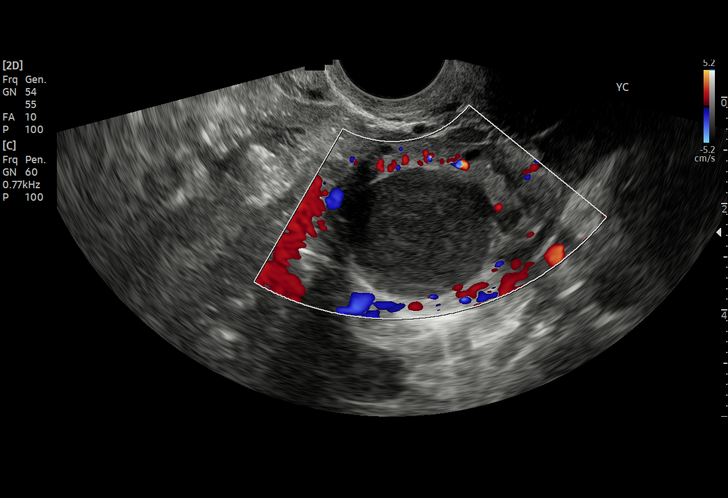
[im 81/89]
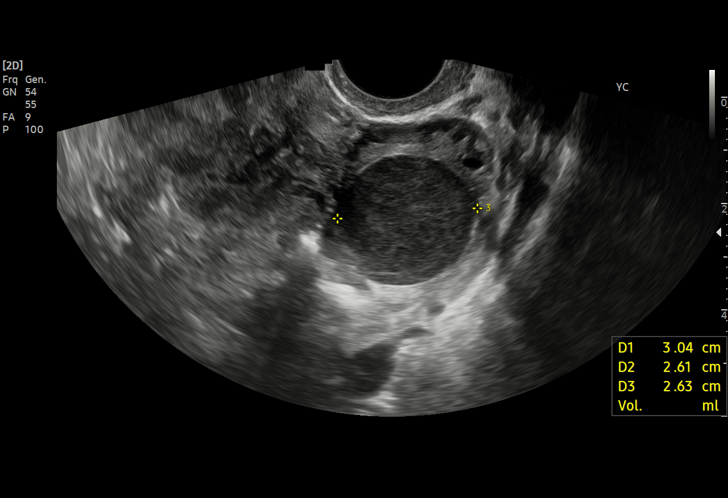
[im 89/89]
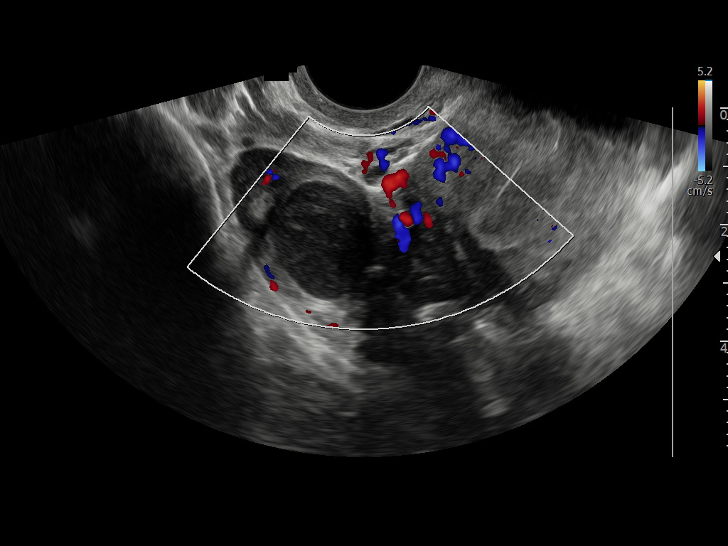

[15 of 25 positions shown; findings below may reference images not displayed]

FINDINGS: Uterus

Measurements: 7.8 x 4.0 x 5.2 cm = volume: 85 mL. No fibroids or
other mass visualized.

Endometrium

Thickness: 17 mm in thickness.  No focal abnormality visualized.

Right ovary

Measurements: 4.5 x 2.2 x 2.7 cm = volume: 13.7 mL. 2.1 cm small
cyst or follicle with internal echoes.

Left ovary

Measurements: 4.2 x 3.2 x 4.0 cm = volume: 27 mL. 3.0 cm hypoechoic
cyst with internal echoes compatible with hemorrhagic cyst.

Pulsed Doppler evaluation of both ovaries demonstrates normal
low-resistance arterial and venous waveforms.

Other findings

No abnormal free fluid.
IMPRESSION: Small bilateral hemorrhagic cysts or follicles. No evidence of
ovarian torsion.

## 2022-03-13 ENCOUNTER — Telehealth: Payer: Self-pay | Admitting: Physician Assistant

## 2022-03-13 DIAGNOSIS — N946 Dysmenorrhea, unspecified: Secondary | ICD-10-CM

## 2022-03-13 MED ORDER — IBUPROFEN 800 MG PO TABS: 800.0000 mg | ORAL_TABLET | Freq: Three times a day (TID) | ORAL | 0 refills | Status: AC | PRN

## 2022-03-13 NOTE — Progress Notes (Signed)
E-Visit for Vaginal Symptoms  We are sorry that you are not feeling well. Here is how we plan to help! Based on what you shared with me it looks like you: Dysmenorrhea (painful menstrual cycles)  Your treatment plan is Ibuprofen 800mg  Take 1 tablet every 8 hours as needed for pain. You can use tylenol between dosing. Magnesium Glycinate 200-250mg  can help cramping as well.   Be sure to take all of the medication as directed. Stop taking any medication if you develop a rash, tongue swelling or shortness of breath. Mothers who are breast feeding should consider pumping and discarding their breast milk while on these antibiotics. However, there is no consensus that infant exposure at these doses would be harmful.  Remember that medication creams can weaken latex condoms. [place] Kitchen   HOME CARE:  Good hygiene may prevent some types of vaginosis from recurring and may relieve some symptoms:  Avoid baths, hot tubs and whirlpool spas. Rinse soap from your outer genital area after a shower, and dry the area well to prevent irritation. Don't use scented or harsh soaps, such as those with deodorant or antibacterial action. Avoid irritants. These include scented tampons and pads. Wipe from front to back after using the toilet. Doing so avoids spreading fecal bacteria to your vagina.  Other things that may help prevent vaginosis include:  Don't douche. Your vagina doesn't require cleansing other than normal bathing. Repetitive douching disrupts the normal organisms that reside in the vagina and can actually increase your risk of vaginal infection. Douching won't clear up a vaginal infection. Use a latex condom. Both female and female latex condoms may help you avoid infections spread by sexual contact. Wear cotton underwear. Also wear pantyhose with a cotton crotch. If you feel comfortable without it, skip wearing underwear to bed. Yeast thrives in [employer] Your symptoms should improve in the next day or  two.  GET HELP RIGHT AWAY IF:  You have pain in your lower abdomen ( pelvic area or over your ovaries) You develop nausea or vomiting You develop a fever Your discharge changes or worsens You have persistent pain with intercourse You develop shortness of breath, a rapid pulse, or you faint.  These symptoms could be signs of problems or infections that need to be evaluated by a medical provider now.  MAKE SURE YOU   Understand these instructions. Will watch your condition. Will get help right away if you are not doing well or get worse.  Thank you for choosing an e-visit.  Your e-visit answers were reviewed by a board certified advanced clinical practitioner to complete your personal care plan. Depending upon the condition, your plan could have included both over the counter or prescription medications.  Please review your pharmacy choice. Make sure the pharmacy is open so you can pick up prescription now. If there is a problem, you may contact your provider through [employer] and have the prescription routed to another pharmacy.  Your safety is important to [place]. If you have drug allergies check your prescription carefully.   For the next 24 hours you can use MyChart to ask questions about today's visit, request a non-urgent call back, or ask for a work or school excuse. You will get an email in the next two days asking about your experience. I hope that your e-visit has been valuable and will speed your recovery.  I have spent 5 minutes in review of e-visit questionnaire, review and updating patient chart, medical decision making and response  to patient.   Mar Daring, PA-C

## 2022-04-08 ENCOUNTER — Telehealth: Payer: Self-pay | Admitting: Family

## 2022-04-08 DIAGNOSIS — J4521 Mild intermittent asthma with (acute) exacerbation: Secondary | ICD-10-CM

## 2022-04-08 NOTE — Progress Notes (Signed)
Visit for Asthma  Based on what you have shared with me, it looks like you may have a flare up of your asthma.  Asthma is a chronic (ongoing) lung disease which results in airway obstruction, inflammation and hyper-responsiveness.   Asthma symptoms vary from person to person, with common symptoms including nighttime awakening and decreased ability to participate in normal activities as a result of shortness of breath. It is often triggered by changes in weather, changes in the season, changes in air temperature, or inside (home, school, daycare or work) allergens such as animal dander, mold, mildew, woodstoves or cockroaches.   It can also be triggered by hormonal changes, extreme emotion, physical exertion or an upper respiratory tract illness.     It is important to identify the trigger, and then eliminate or avoid the trigger if possible.   If you have been prescribed medications to be taken on a regular basis, it is important to follow the asthma action plan and to follow guidelines to adjust medication in response to increasing symptoms of decreased peak expiratory flow rate  Treatment: I have prescribed: Albuterol (Proventil HFA; Ventolin HFA) 108 (90 Base) MCG/ACT Inhaler 2 puffs into the lungs every six hours as needed for wheezing or shortness of breath.  You need to follow up with your PCP for longer acting inhaler, they may be able to get your patient assistance.   HOME CARE Only take medications as instructed by your medical team. Consider wearing a mask or scarf to improve breathing air temperature have been shown to decrease irritation and decrease exacerbations Get rest. Taking a steamy shower or using a humidifier may help nasal congestion sand ease sore throat pain. You can place a towel over your head and breathe in the steam from hot water coming from a faucet. Using  a saline nasal spray works much the same way.  Cough drops, hare candies and sore throat lozenges may ease your cough.  Avoid close contacts especially the very you and the elderly Cover your mouth if you cough or sneeze Always remember to wash your hands.    GET HELP RIGHT AWAY IF: You develop worsening symptoms; breathlessness at rest, drowsy, confused or agitated, unable to speak in full sentences You have coughing fits You develop a severe headache or visual changes You develop shortness of breath, difficulty breathing or start having chest pain Your symptoms persist after you have completed your treatment plan If your symptoms do not improve within 10 days  MAKE SURE YOU Understand these instructions. Will watch your condition. Will get help right away if you are not doing well or get worse.   Your e-visit answers were reviewed by a board certified advanced clinical practitioner to complete your personal care plan, Depending upon the condition, your plan could have included both over the counter or prescription medications.   Please review your pharmacy choice. Your safety is important to [place]. If you have drug allergies check your prescription carefully.  You can use MyChart to ask questions about today's visit, request a non-urgent  call back, or ask for a work or school excuse for 24 hours related to this e-Visit. If it has been greater than 24 hours you will need to follow up with your provider, or enter a new e-Visit to address those concerns.   You will get an e-mail in the next two days asking about your experience. I hope that your e-visit has been valuable and will speed your recovery. Thank you for  using e-visits.  Approximately 5 minutes was spent documenting and reviewing patient's chart.

## 2022-04-09 ENCOUNTER — Telehealth: Payer: Self-pay | Admitting: Family Medicine

## 2022-04-09 DIAGNOSIS — J4521 Mild intermittent asthma with (acute) exacerbation: Secondary | ICD-10-CM

## 2022-04-09 NOTE — Progress Notes (Signed)
Because you are testing negative on home test- you should follow up for a PCR testing and in person assessment with worsening conditions, I feel your condition warrants further evaluation and I recommend that you be seen in a face to face visit.   NOTE: There will be NO CHARGE for this eVisit

## 2022-05-01 ENCOUNTER — Telehealth: Payer: Self-pay | Admitting: Physician Assistant

## 2022-05-01 DIAGNOSIS — J4521 Mild intermittent asthma with (acute) exacerbation: Secondary | ICD-10-CM

## 2022-05-01 MED ORDER — ALBUTEROL SULFATE HFA 108 (90 BASE) MCG/ACT IN AERS: 1.0000 | INHALATION_SPRAY | Freq: Four times a day (QID) | RESPIRATORY_TRACT | 0 refills | Status: DC | PRN

## 2022-05-01 NOTE — Progress Notes (Signed)
Visit for Asthma  Based on what you have shared with me, it looks like you may have a flare up of your asthma.  Asthma is a chronic (ongoing) lung disease which results in airway obstruction, inflammation and hyper-responsiveness.   Asthma symptoms vary from person to person, with common symptoms including nighttime awakening and decreased ability to participate in normal activities as a result of shortness of breath. It is often triggered by changes in weather, changes in the season, changes in air temperature, or inside (home, school, daycare or work) allergens such as animal dander, mold, mildew, woodstoves or cockroaches.   It can also be triggered by hormonal changes, extreme emotion, physical exertion or an upper respiratory tract illness.     It is important to identify the trigger, and then eliminate or avoid the trigger if possible.   If you have been prescribed medications to be taken on a regular basis, it is important to follow the asthma action plan and to follow guidelines to adjust medication in response to increasing symptoms of decreased peak expiratory flow rate  Treatment: I have prescribed: Albuterol (Proventil HFA; Ventolin HFA) 108 (90 Base) MCG/ACT Inhaler 2 puffs into the lungs every six hours as needed for wheezing or shortness of breath  HOME CARE Only take medications as instructed by your medical team. Consider wearing a mask or scarf to improve breathing air temperature have been shown to decrease irritation and decrease exacerbations Get rest. Taking a steamy shower or using a humidifier may help nasal congestion sand ease sore throat pain. You can place a towel over your head and breathe in the steam from hot water coming from a faucet. Using a saline nasal spray works much the same way.  Cough drops, hare candies and sore throat lozenges may ease your  cough.  Avoid close contacts especially the very you and the elderly Cover your mouth if you cough or sneeze Always remember to wash your hands.    GET HELP RIGHT AWAY IF: You develop worsening symptoms; breathlessness at rest, drowsy, confused or agitated, unable to speak in full sentences You have coughing fits You develop a severe headache or visual changes You develop shortness of breath, difficulty breathing or start having chest pain Your symptoms persist after you have completed your treatment plan If your symptoms do not improve within 10 days  MAKE SURE YOU Understand these instructions. Will watch your condition. Will get help right away if you are not doing well or get worse.   Your e-visit answers were reviewed by a board certified advanced clinical practitioner to complete your personal care plan, Depending upon the condition, your plan could have included both over the counter or prescription medications.   Please review your pharmacy choice. Your safety is important to us. If you have drug allergies check your prescription carefully.  You can use MyChart to ask questions about today's visit, request a non-urgent  call back, or ask for a work or school excuse for 24 hours related to this e-Visit. If it has been greater than 24 hours you will need to follow up with your provider, or enter a new e-Visit to address those concerns.   You will get an e-mail in the next two days asking about your experience. I hope that your e-visit has been valuable and will speed your recovery. Thank you for using e-visits.  I have spent 5 minutes in review of e-visit questionnaire, review and updating patient chart, medical decision making and response   to patient.   Tayleigh Wetherell M Rolfe Hartsell, PA-C  

## 2022-05-29 ENCOUNTER — Telehealth: Payer: Medicaid Other | Admitting: Physician Assistant

## 2022-05-29 DIAGNOSIS — J45909 Unspecified asthma, uncomplicated: Secondary | ICD-10-CM

## 2022-05-29 NOTE — Progress Notes (Signed)
Because we do not do chronic management via e-visit and due to repeat visits for your asthma recently, I feel your condition warrants further evaluation and I recommend that you be seen for a face to face visit.  Please contact your primary care physician practice to be seen. Many offices offer virtual options to be seen via video if you are not comfortable going in person to a medical facility at this time.  NOTE: You will NOT be charged for this eVisit.  If you do not have a PCP, [facility] offers a free physician referral service available at [phone]. Our trained staff has the experience, knowledge and resources to put you in touch with a physician who is right for you.    If you are having a true medical emergency please call 911.   Your e-visit answers were reviewed by a board certified advanced clinical practitioner to complete your personal care plan.  Thank you for using e-Visits.

## 2022-06-08 ENCOUNTER — Ambulatory Visit: Payer: Medicaid Other | Admitting: Nurse Practitioner

## 2022-07-30 ENCOUNTER — Telehealth: Payer: Medicaid Other | Admitting: Physician Assistant

## 2022-07-30 DIAGNOSIS — J45909 Unspecified asthma, uncomplicated: Secondary | ICD-10-CM | POA: Diagnosis not present

## 2022-07-30 MED ORDER — ALBUTEROL SULFATE HFA 108 (90 BASE) MCG/ACT IN AERS: 1.0000 | INHALATION_SPRAY | Freq: Four times a day (QID) | RESPIRATORY_TRACT | 0 refills | Status: AC | PRN

## 2022-07-30 NOTE — Progress Notes (Signed)
E-Visit for Asthma  Based on what you have shared with me, it looks like you may have a flare up of your asthma.  Asthma is a chronic (ongoing) lung disease which results in airway obstruction, inflammation and hyper-responsiveness.   Asthma symptoms vary from person to person, with common symptoms including nighttime awakening and decreased ability to participate in normal activities as a result of shortness of breath. It is often triggered by changes in weather, changes in the season, changes in air temperature, or inside (home, school, daycare or work) allergens such as animal dander, mold, mildew, woodstoves or cockroaches.   It can also be triggered by hormonal changes, extreme emotion, physical exertion or an upper respiratory tract illness.     It is important to identify the trigger, and then eliminate or avoid the trigger if possible.   If you have been prescribed medications to be taken on a regular basis, it is important to follow the asthma action plan and to follow guidelines to adjust medication in response to increasing symptoms of decreased peak expiratory flow rate  Treatment: I have prescribed: Albuterol (Proventil HFA; Ventolin HFA) 108 (90 Base) MCG/ACT Inhaler 2 puffs into the lungs every six hours as needed for wheezing or shortness of breath  I will send a refill of your albuterol, but highly recommend for you to establish with a Primary Care Provider for asthma management if you do not have one. We will be unable to continue to refill maintenance medications for chronic issues. You can visit the link below to establish care: [email or web address]  HOME CARE Only take medications as instructed by your medical team. Consider wearing a mask or scarf to improve breathing air temperature have been shown to decrease irritation and decrease  exacerbations Get rest. Taking a steamy shower or using a humidifier may help nasal congestion sand ease sore throat pain. You can place a towel over your head and breathe in the steam from hot water coming from a faucet. Using a saline nasal spray works much the same way.  Cough drops, hare candies and sore throat lozenges may ease your cough.  Avoid close contacts especially the very you and the elderly Cover your mouth if you cough or sneeze Always remember to wash your hands.    GET HELP RIGHT AWAY IF: You develop worsening symptoms; breathlessness at rest, drowsy, confused or agitated, unable to speak in full sentences You have coughing fits You develop a severe headache or visual changes You develop shortness of breath, difficulty breathing or start having chest pain Your symptoms persist after you have completed your treatment plan If your symptoms do not improve within 10 days  MAKE SURE YOU Understand these instructions. Will watch your condition. Will get help right away if you are not doing well or get worse.   Your e-visit answers were reviewed by a board certified advanced clinical practitioner to complete your personal care plan, Depending upon the condition, your plan could have included both over the counter or prescription medications.   Please review your pharmacy choice. Your safety is important to [place]. If you have drug allergies check your prescription carefully.  You can use MyChart to ask questions about today's visit, request a non-urgent  call back, or ask for a work or school excuse for 24 hours related to this e-Visit. If it has been greater than 24 hours you will need to follow up with your provider, or enter a new e-Visit to address those concerns.  You will get an e-mail in the next two days asking about your experience. I hope that your e-visit has been valuable and will speed your recovery. Thank you for using e-visits.  I have spent 5 minutes in review  of e-visit questionnaire, review and updating patient chart, medical decision making and response to patient.   Margaretann Loveless, PA-C

## 2022-09-03 ENCOUNTER — Telehealth: Payer: Medicaid Other

## 2022-09-11 ENCOUNTER — Other Ambulatory Visit (INDEPENDENT_AMBULATORY_CARE_PROVIDER_SITE_OTHER): Payer: Self-pay | Admitting: Physician Assistant

## 2022-09-11 ENCOUNTER — Telehealth: Payer: Medicaid Other | Admitting: Physician Assistant

## 2022-09-11 DIAGNOSIS — B9789 Other viral agents as the cause of diseases classified elsewhere: Secondary | ICD-10-CM

## 2022-09-11 DIAGNOSIS — N946 Dysmenorrhea, unspecified: Secondary | ICD-10-CM

## 2022-09-11 DIAGNOSIS — J019 Acute sinusitis, unspecified: Secondary | ICD-10-CM

## 2022-09-11 DIAGNOSIS — Z20822 Contact with and (suspected) exposure to covid-19: Secondary | ICD-10-CM

## 2022-09-11 MED ORDER — IPRATROPIUM BROMIDE 0.03 % NA SOLN: 2.0000 | Freq: Two times a day (BID) | NASAL | 0 refills | Status: AC

## 2022-09-11 NOTE — Progress Notes (Signed)
I have spent 5 minutes in review of e-visit questionnaire, review and updating patient chart, medical decision making and response to patient.   William Cody Martin, PA-C    

## 2022-09-11 NOTE — Progress Notes (Signed)
E-Visit for Sinus Problems  We are sorry that you are not feeling well.  Here is how we plan to help!  Based on what you have shared with me it looks like you have sinusitis.  Sinusitis is inflammation and infection in the sinus cavities of the head.  Based on your presentation I believe you most likely have Acute Viral Sinusitis.This is an infection most likely caused by a virus. There is not specific treatment for viral sinusitis other than to help you with the symptoms until the infection runs its course.  You may use an oral decongestant such as Mucinex D or if you have glaucoma or high blood pressure use plain Mucinex. Saline nasal spray help and can safely be used as often as needed for congestion, I have prescribed: Ipratropium Bromide nasal spray 0.03% 2 sprays in eah nostril 2-3 times a day  Some authorities believe that zinc sprays or the use of Echinacea may shorten the course of your symptoms.  Sinus infections are not as easily transmitted as other respiratory infection, however we still recommend that you avoid close contact with loved ones, especially the very young and elderly.  Remember to wash your hands thoroughly throughout the day as this is the number one way to prevent the spread of infection!  Home Care: Only take medications as instructed by your medical team. Do not take these medications with alcohol. A steam or ultrasonic humidifier can help congestion.  You can place a towel over your head and breathe in the steam from hot water coming from a faucet. Avoid close contacts especially the very young and the elderly. Cover your mouth when you cough or sneeze. Always remember to wash your hands.  Get Help Right Away If: You develop worsening fever or sinus pain. You develop a severe head ache or visual changes. Your symptoms persist after you have completed your treatment plan.  Make sure you Understand these instructions. Will watch your condition. Will get help  right away if you are not doing well or get worse.   Thank you for choosing an e-visit.  Your e-visit answers were reviewed by a board certified advanced clinical practitioner to complete your personal care plan. Depending upon the condition, your plan could have included both over the counter or prescription medications.  Please review your pharmacy choice. Make sure the pharmacy is open so you can pick up prescription now. If there is a problem, you may contact your provider through MyChart messaging and have the prescription routed to another pharmacy.  Your safety is important to us. If you have drug allergies check your prescription carefully.   For the next 24 hours you can use MyChart to ask questions about today's visit, request a non-urgent call back, or ask for a work or school excuse. You will get an email in the next two days asking about your experience. I hope that your e-visit has been valuable and will speed your recovery.
